# Patient Record
Sex: Male | Born: 1970
Health system: Southern US, Community
[De-identification: ages and names within clinical notes are randomized; demographics above are authoritative.]

## PROBLEM LIST (undated history)

## (undated) DIAGNOSIS — F32A Depression, unspecified: Secondary | ICD-10-CM

## (undated) DIAGNOSIS — T8859XA Other complications of anesthesia, initial encounter: Secondary | ICD-10-CM

## (undated) DIAGNOSIS — R112 Nausea with vomiting, unspecified: Secondary | ICD-10-CM

## (undated) DIAGNOSIS — F329 Major depressive disorder, single episode, unspecified: Secondary | ICD-10-CM

## (undated) DIAGNOSIS — Z9889 Other specified postprocedural states: Secondary | ICD-10-CM

## (undated) DIAGNOSIS — M549 Dorsalgia, unspecified: Secondary | ICD-10-CM

## (undated) DIAGNOSIS — T4145XA Adverse effect of unspecified anesthetic, initial encounter: Secondary | ICD-10-CM

## (undated) HISTORY — PX: BACK SURGERY: SHX140

---
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
## 2008-09-05 ENCOUNTER — Encounter
Admission: RE | Admit: 2008-09-05 | Discharge: 2008-09-26 | Payer: Self-pay | Admitting: Physical Medicine & Rehabilitation

## 2008-09-10 ENCOUNTER — Ambulatory Visit: Payer: Self-pay | Admitting: Physical Medicine & Rehabilitation

## 2008-09-23 ENCOUNTER — Ambulatory Visit: Payer: Self-pay | Admitting: Physical Medicine & Rehabilitation

## 2008-11-06 ENCOUNTER — Ambulatory Visit (HOSPITAL_COMMUNITY): Admission: RE | Admit: 2008-11-06 | Discharge: 2008-11-07 | Payer: Self-pay | Admitting: Neurosurgery

## 2010-04-09 LAB — CBC
HCT: 39.9 % (ref 39.0–52.0)
Hemoglobin: 13.9 g/dL (ref 13.0–17.0)
MCHC: 34.9 g/dL (ref 30.0–36.0)
MCV: 92.6 fL (ref 78.0–100.0)
Platelets: 255 10*3/uL (ref 150–400)
RBC: 4.31 MIL/uL (ref 4.22–5.81)
RDW: 12.9 % (ref 11.5–15.5)
WBC: 6.3 10*3/uL (ref 4.0–10.5)

## 2010-05-22 NOTE — Procedures (Signed)
NAMEBALTAZAR, Phillip Kane                ACCOUNT NO.:  [id number]   MEDICAL RECORD NO.:  [id number]          PATIENT TYPE:  REC   LOCATION:  [facility]                          FACILITY:  [facility]   PHYSICIAN:  Erick Colace, M.D.DATE OF BIRTH:  Jan 28, 1970   DATE OF PROCEDURE:  DATE OF DISCHARGE:                               OPERATIVE REPORT   PROCEDURE:  Left S1 and left L5 transforaminal lumbar epidural steroid  injection under fluoroscopic guidance.   INDICATIONS:  Left L5 and left S1 radiculopathy.  MRI demonstrating L4-5  disk compressing left L5 and S1 nerve roots.  Pain is only partially  responsive to medication management and other conservative care,  interferes with self-care mobility.   Informed consent was obtained after describing the risks and benefits of  the procedure with the patient.  These include bleeding, bruising, and  infection.  The patient elects to proceed and has given written consent.  The patient is placed prone on fluoroscopy table.  Betadine prep.  Sterile drape.  A 25-gauge 1-1/2-inch needle was used to anesthetize the  skin and subcu tissue.  A 1% lidocaine x2 mL at each two sites.  Then a  22-gauge 3-1/2-inch spinal needle was inserted first in the left S1  foramen.  AP, lateral, and oblique images utilized.  Omnipaque 180 under  live fluoro demonstrated good epidural spread followed by injection of 1  mL of 10 mg/mL dexamethasone and 1.5 mL of 1% lidocaine.  The same  procedure was repeated at the left L5-S1 intervertebral foramen using  same needle injectate and technique.  The patient tolerated the  procedure well.  Pre and post-injection vitals stable.  Post-injection  instructions given.  He will follow up with Dr. Lovell Sheehan.  See me on a  p.r.n. basis for repeat injection.      Erick Colace, M.D.  Electronically Signed     AEK/MEDQ  D:  09/10/2008 15:27:18  T:  09/11/2008 05:07:00  Job:  [id number]   cc:   Cristi Loron, M.D.  Fax:  [phone]

## 2010-05-22 NOTE — Procedures (Signed)
NAMEOLUMIDE, DOLINGER                ACCOUNT NO.:  [id number]   MEDICAL RECORD NO.:  [id number]          PATIENT TYPE:  REC   LOCATION:  [facility]                          FACILITY:  [facility]   PHYSICIAN:  Erick Colace, M.D.DATE OF BIRTH:  1970-09-17   DATE OF PROCEDURE:  09/23/2008  DATE OF DISCHARGE:                               OPERATIVE REPORT   This is a left S1 and left L5 transforaminal lumbar epidural steroid  injection under fluoroscopic guidance.   INDICATIONS:  Left L5 and left S1 radiculopathy.  MRI demonstrated L4-5  disk compressing both the left L5 and S1 nerve roots.  Pain is only  partially responsive to medication management.  Other conservative care  interferes with self-care mobility.  Onset was approximately 4 months  ago.   Previous injection was helpful, but is starting to wear off.  This was  performed 2 weeks ago.   Informed consent was obtained after describing risks and benefits of the  procedure with the patient.  These include bleeding, bruising,  infection.  He elects to proceed and has given written consent.  The  patient placed prone on fluoroscopy table.  Betadine prep, sterile  drape, a 25-gauge 1-1/2-inch needle was used to anesthetize skin and  subcu tissue with 1% lidocaine.  Then a 22-gauge 3-1/2-inch spinal  needle was inserted first at left S1 foramen; AP, lateral, and oblique  images utilized.  Omnipaque 180 under live fluoro demonstrated no  intravascular uptake and good epidural spread followed by injection of 1  mL of 10 mg/mL of dexamethasone and 1.5 mL of 1% lidocaine.  This  procedure was also performed on the left L5-S1 intervertebral foramen  using same needle, injectate, and technique.  The patient tolerated the  procedure well.  Pre and post injection vitals stable.  Post injection  instructions given.  Pre injection 7/10 and post injection 3/10.  Follow  up with Dr. Lovell Sheehan.      Erick Colace, M.D.  Electronically  Signed     AEK/MEDQ  D:  09/23/2008 11:45:30  T:  09/24/2008 04:21:11  Job:  [id number]   cc:   Cristi Loron, M.D.  Fax: [phone]

## 2013-07-25 ENCOUNTER — Ambulatory Visit (INDEPENDENT_AMBULATORY_CARE_PROVIDER_SITE_OTHER): Payer: PRIVATE HEALTH INSURANCE | Admitting: Family Medicine

## 2013-07-25 ENCOUNTER — Ambulatory Visit: Payer: PRIVATE HEALTH INSURANCE

## 2013-07-25 ENCOUNTER — Ambulatory Visit (INDEPENDENT_AMBULATORY_CARE_PROVIDER_SITE_OTHER): Payer: PRIVATE HEALTH INSURANCE

## 2013-07-25 VITALS — BP 130/72 | HR 63 | Temp 97.9°F | Resp 14 | Ht 75.0 in | Wt 187.0 lb

## 2013-07-25 DIAGNOSIS — M25551 Pain in right hip: Secondary | ICD-10-CM

## 2013-07-25 DIAGNOSIS — M5430 Sciatica, unspecified side: Secondary | ICD-10-CM

## 2013-07-25 DIAGNOSIS — M25561 Pain in right knee: Secondary | ICD-10-CM

## 2013-07-25 DIAGNOSIS — M25569 Pain in unspecified knee: Secondary | ICD-10-CM

## 2013-07-25 DIAGNOSIS — T148XXA Other injury of unspecified body region, initial encounter: Secondary | ICD-10-CM

## 2013-07-25 DIAGNOSIS — M25559 Pain in unspecified hip: Secondary | ICD-10-CM

## 2013-07-25 DIAGNOSIS — M5441 Lumbago with sciatica, right side: Secondary | ICD-10-CM

## 2013-07-25 DIAGNOSIS — R202 Paresthesia of skin: Secondary | ICD-10-CM

## 2013-07-25 DIAGNOSIS — R209 Unspecified disturbances of skin sensation: Secondary | ICD-10-CM

## 2013-07-25 MED ORDER — CYCLOBENZAPRINE HCL 5 MG PO TABS: 5.0000 mg | ORAL_TABLET | Freq: Every evening | ORAL | Status: DC | PRN

## 2013-07-25 MED ORDER — IBUPROFEN 600 MG PO TABS: 600.0000 mg | ORAL_TABLET | Freq: Three times a day (TID) | ORAL | Status: DC | PRN

## 2013-07-25 NOTE — Progress Notes (Signed)
Chief Complaint:  Chief Complaint  Patient presents with  . right knee pain    HPI: Phillip Kane is a 43 y.o. male who is here for Right hip. Low back and also lower leg pain which started after he caught his right foot on a pallet at work several days ago. He did not report this to his supervisor but instead went to the nruse and was given some tylenol and continues working even with the pain. 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 He was walkign and accidentally kicked his foot against one and when this happened his hip and leg went outward , sounds like his right hip externally rotated and started having pain . He also  has numbness and tingling associated with this which he never has had before. He has tried tylenol and also just massaging the area, hot and cold pads and that makes it better. No weakness, no prior hsitory of paresthesia, hip or knee pain or leg pain. HAs ahd problems with low back and has had surgery ? L4. No urinary sxs.   No past medical history on file. No past surgical history on file. History   Social History  . Marital Status: Married    Spouse Name: N/A    Number of Children: N/A  . Years of Education: N/A   Social History Main Topics  . Smoking status: Former Games developer  . Smokeless tobacco: Never Used  . Alcohol Use: Yes  . Drug Use: No  . Sexual Activity: None   Other Topics Concern  . None   Social History Narrative  . None   No family history on file. Allergies  Allergen Reactions  . Hydrocodone Nausea And Vomiting   Prior to Admission medications   Not on File     ROS: The patient denies fevers, chills, night sweats, unintentional weight loss, chest pain, palpitations, wheezing, dyspnea on exertion, nausea, vomiting, abdominal pain, dysuria, hematuria, melena, numbness, weakness, + tingling  All other systems have  been reviewed and were otherwise negative with the exception of those mentioned in the HPI and as above.    PHYSICAL EXAM: Filed Vitals:   07/25/13 1934  BP: 130/72  Pulse: 63  Temp: 97.9 F (36.6 C)  Resp: 14   Filed Vitals:   07/25/13 1934  Height: 6\' 3"  (1.905 m)  Weight: 187 lb (84.823 kg)   Body mass index is 23.37 kg/(m^2).  General: Alert, no acute distress HEENT:  Normocephalic, atraumatic, oropharynx patent. EOMI, PERRLA Cardiovascular:  Regular rate and rhythm, no rubs murmurs or gallops.  No Carotid bruits, radial pulse intact. No pedal edema.  Respiratory: Clear to auscultation bilaterally.  No wheezes, rales, or rhonchi.  No cyanosis, no use of accessory musculature GI: No organomegaly, abdomen is soft and non-tender, positive bowel sounds.  No masses. Skin: No rashes. Neurologic: Facial musculature symmetric. Psychiatric: Patient is appropriate throughout our interaction. Lymphatic: No cervical lymphadenopathy Musculoskeletal: Gait antalgic Neg deformity, Homans,  Full ROM 5/5 strength, 2/2 DTRs No saddle anesthesia Straight leg negative Hip and knee exam--normal    LABS: Results for orders placed during the hospital encounter of 11/06/08  CBC      Result Value Ref Range   WBC 6.3  4.0 - 10.5 K/uL   RBC 4.31  4.22 - 5.81 MIL/uL   Hemoglobin 13.9  13.0 -  17.0 g/dL   HCT 16.139.9  09.639.0 - 04.552.0 %   MCV 92.6  78.0 - 100.0 fL   MCHC 34.9  30.0 - 36.0 g/dL   RDW 40.912.9  81.111.5 - 91.415.5 %   Platelets 255  150 - 400 K/uL     EKG/XRAY:   [facility]. Neg for fx or dislocation   ASSESSMENT/PLAN: Encounter Diagnoses  Name Primary?  . Right-sided low back pain with right-sided sciatica Yes  . Hip pain, acute, right   . Pain in joint, lower leg, right   . Paresthesia   . Sprain and strain     Rx Ibuprofen Rx Flexeril He declined any steroids at thsi time, will call me if he needs it Neuro exam was normal F/u prn  Gross  sideeffects, risk and benefits, and alternatives of medications d/w patient. Patient is aware that all medications have potential sideeffects and we are unable to predict every sideeffect or drug-drug interaction that may occur.  Mersadies Petree PHUONG, DO 07/25/2013 9:00 PM

## 2013-08-29 ENCOUNTER — Telehealth: Payer: Self-pay

## 2014-06-18 ENCOUNTER — Telehealth: Payer: Self-pay

## 2014-12-10 NOTE — Telephone Encounter (Signed)
done

## 2015-01-08 ENCOUNTER — Ambulatory Visit (INDEPENDENT_AMBULATORY_CARE_PROVIDER_SITE_OTHER): Payer: PRIVATE HEALTH INSURANCE | Admitting: Emergency Medicine

## 2015-01-08 VITALS — BP 160/78 | HR 96 | Temp 98.2°F | Resp 18 | Ht 76.0 in | Wt 200.0 lb

## 2015-01-08 DIAGNOSIS — R062 Wheezing: Secondary | ICD-10-CM | POA: Diagnosis not present

## 2015-01-08 DIAGNOSIS — T7840XA Allergy, unspecified, initial encounter: Secondary | ICD-10-CM

## 2015-01-08 MED ORDER — DIPHENHYDRAMINE HCL 25 MG PO CAPS: 25.0000 mg | ORAL_CAPSULE | Freq: Four times a day (QID) | ORAL | Status: DC | PRN

## 2015-01-08 MED ORDER — ALBUTEROL SULFATE (2.5 MG/3ML) 0.083% IN NEBU
5.0000 mg | INHALATION_SOLUTION | Freq: Once | RESPIRATORY_TRACT | Status: AC
  Administered 2015-01-08: 5 mg via RESPIRATORY_TRACT

## 2015-01-08 MED ORDER — METHYLPREDNISOLONE SODIUM SUCC 125 MG IJ SOLR
125.0000 mg | Freq: Once | INTRAMUSCULAR | Status: AC
  Administered 2015-01-08: 125 mg via INTRAVENOUS

## 2015-01-08 MED ORDER — DIPHENHYDRAMINE HCL 25 MG PO CAPS
50.0000 mg | ORAL_CAPSULE | Freq: Once | ORAL | Status: AC
  Administered 2015-01-08: 50 mg via ORAL

## 2015-01-08 MED ORDER — PREDNISONE 10 MG (21) PO TBPK: ORAL_TABLET | ORAL | Status: DC

## 2015-01-08 NOTE — Progress Notes (Signed)
Subjective:  Patient ID: Phillip Kane, male    DOB: 12-23-1970  Age: 45 y.o. MRN: [id number]  CC: Allergic Reaction   HPI Phillip ParisSenad Kane presents  patient was brought back as an emergency with generalized hives and wheezing and shortness of breath after taking a dose of amoxicillin today. He is unclear about why he is taking that dose of antibiotic he said it's been laying around his house for quite a while. He does admit to some sneezing before he took the antibiotic and thought he might have an upper respiratory infection. He denies any fever chills chest pain tightness or heaviness. Has no nausea or vomiting. He has no cough nasal congestion postnasal drainage. No difficulty swallowing. Taken no medication treat his symptoms.  History Phillip Kane has no past medical history on file.   He has no past surgical history on file.   His  family history is not on file.  He   reports that he has quit smoking. He has never used smokeless tobacco. He reports that he drinks alcohol. He reports that he does not use illicit drugs.  Outpatient Prescriptions Prior to Visit  Medication Sig Dispense Refill  . cyclobenzaprine (FLEXERIL) 5 MG tablet Take 1 tablet (5 mg total) by mouth at bedtime as needed for muscle spasms. (Patient not taking: Reported on 01/08/2015) 30 tablet 0  . ibuprofen (ADVIL,MOTRIN) 600 MG tablet Take 1 tablet (600 mg total) by mouth every 8 (eight) hours as needed. Take with food, no other NSAIDs. (Patient not taking: Reported on 01/08/2015) 30 tablet 1   No facility-administered medications prior to visit.    Social History   Social History  . Marital Status: Married    Spouse Name: N/A  . Number of Children: N/A  . Years of Education: N/A   Social History Main Topics  . Smoking status: Former Games developermoker  . Smokeless tobacco: Never Used  . Alcohol Use: Yes  . Drug Use: No  . Sexual Activity: Not Asked   Other Topics Concern  . None   Social History Narrative      Review of Systems  Constitutional: Positive for fatigue. Negative for fever, chills and appetite change.  HENT: Negative for congestion, ear pain, postnasal drip, sinus pressure and sore throat.   Eyes: Negative for pain and redness.  Respiratory: Positive for wheezing. Negative for cough and shortness of breath.   Cardiovascular: Negative for leg swelling.  Gastrointestinal: Negative for nausea, vomiting, abdominal pain, diarrhea, constipation and blood in stool.  Endocrine: Negative for polyuria.  Genitourinary: Negative for dysuria, urgency, frequency and flank pain.  Musculoskeletal: Negative for gait problem.  Skin: Positive for rash.  Neurological: Negative for weakness and headaches.  Psychiatric/Behavioral: Negative for confusion and decreased concentration. The patient is not nervous/anxious.     Objective:  BP 160/78 mmHg  Pulse 96  Temp(Src) 98.2 F (36.8 C) (Oral)  Resp 18  Ht 6\' 4"  (1.93 m)  Wt 200 lb (90.719 kg)  BMI 24.35 kg/m2  SpO2 98%  Physical Exam  Constitutional: He is oriented to person, place, and time. He appears well-developed and well-nourished. No distress.  HENT:  Head: Normocephalic and atraumatic.  Right Ear: External ear normal.  Left Ear: External ear normal.  Nose: Nose normal.  Eyes: Conjunctivae and EOM are normal. Pupils are equal, round, and reactive to light. No scleral icterus.  Neck: Normal range of motion. Neck supple. No tracheal deviation present.  Cardiovascular: Normal rate, regular rhythm and normal heart sounds.  Pulmonary/Chest: Effort normal. No respiratory distress. He has wheezes. He has no rales.  Abdominal: He exhibits no mass. There is no tenderness. There is no rebound and no guarding.  Musculoskeletal: He exhibits no edema.  Lymphadenopathy:    He has no cervical adenopathy.  Neurological: He is alert and oriented to person, place, and time. Coordination normal.  Skin: Skin is warm and dry. Rash (Generalized  hives) noted.  Psychiatric: He has a normal mood and affect. His behavior is normal.      Assessment & Plan:   Phillip Kane was seen today for allergic reaction.  Diagnoses and all orders for this visit:  Allergic reaction, initial encounter -     diphenhydrAMINE (BENADRYL) capsule 50 mg; Take 2 capsules (50 mg total) by mouth once. -     methylPREDNISolone sodium succinate (SOLU-MEDROL) 125 mg/2 mL injection 125 mg; Inject 2 mLs (125 mg total) into the vein once.  Wheezing -     albuterol (PROVENTIL) (2.5 MG/3ML) 0.083% nebulizer solution 5 mg; Take 6 mLs (5 mg total) by nebulization once.  Other orders -     predniSONE (STERAPRED UNI-PAK 21 TAB) 10 MG (21) TBPK tablet; Take as directed on package -     diphenhydrAMINE (BENADRYL) 25 mg capsule; Take 1 capsule (25 mg total) by mouth every 6 (six) hours as needed.   I am having Phillip Kane start on predniSONE and diphenhydrAMINE. I am also having him maintain his ibuprofen and cyclobenzaprine. We administered diphenhydrAMINE, albuterol, and methylPREDNISolone sodium succinate.  Meds ordered this encounter  Medications  . diphenhydrAMINE (BENADRYL) capsule 50 mg    Sig:   . albuterol (PROVENTIL) (2.5 MG/3ML) 0.083% nebulizer solution 5 mg    Sig:   . predniSONE (STERAPRED UNI-PAK 21 TAB) 10 MG (21) TBPK tablet    Sig: Take as directed on package    Dispense:  21 tablet    Refill:  0  . diphenhydrAMINE (BENADRYL) 25 mg capsule    Sig: Take 1 capsule (25 mg total) by mouth every 6 (six) hours as needed.    Dispense:  30 capsule    Refill:  0  . methylPREDNISolone sodium succinate (SOLU-MEDROL) 125 mg/2 mL injection 125 mg    Sig:    He is markedly improved at the time of discharge and brought back initially as an emergency and interrupted the flow patients. Not nebulized aerosol treatment intravenous Solu-Medrol and Benadryl and had marked improvement in his aeration and cessation of wheezing is admonished not to take any more  amoxicillin and to always finish his antibiotics when he is prescribed  Appropriate red flag conditions were discussed with the patient as well as actions that should be taken.  Patient expressed his understanding.  Follow-up: Return if symptoms worsen or fail to improve.  Carmelina Dane, MD

## 2015-01-08 NOTE — Patient Instructions (Signed)
Hives Hives are itchy, red, swollen areas of the skin. They can vary in size and location on your body. Hives can come and go for hours or several days (acute hives) or for several weeks (chronic hives). Hives do not spread from person to person (noncontagious). They may get worse with scratching, exercise, and emotional stress. CAUSES   Allergic reaction to food, additives, or drugs.  Infections, including the common cold.  Illness, such as vasculitis, lupus, or thyroid disease.  Exposure to sunlight, heat, or cold.  Exercise.  Stress.  Contact with chemicals. SYMPTOMS   Red or white swollen patches on the skin. The patches may change size, shape, and location quickly and repeatedly.  Itching.  Swelling of the hands, feet, and face. This may occur if hives develop deeper in the skin. DIAGNOSIS  Your caregiver can usually tell what is wrong by performing a physical exam. Skin or blood tests may also be done to determine the cause of your hives. In some cases, the cause cannot be determined. TREATMENT  Mild cases usually get better with medicines such as antihistamines. Severe cases may require an emergency epinephrine injection. If the cause of your hives is known, treatment includes avoiding that trigger.  HOME CARE INSTRUCTIONS   Avoid causes that trigger your hives.  Take antihistamines as directed by your caregiver to reduce the severity of your hives. Non-sedating or low-sedating antihistamines are usually recommended. Do not drive while taking an antihistamine.  Take any other medicines prescribed for itching as directed by your caregiver.  Wear loose-fitting clothing.  Keep all follow-up appointments as directed by your caregiver. SEEK MEDICAL CARE IF:   You have persistent or severe itching that is not relieved with medicine.  You have painful or swollen joints. SEEK IMMEDIATE MEDICAL CARE IF:   You have a fever.  Your tongue or lips are swollen.  You have  trouble breathing or swallowing.  You feel tightness in the throat or chest.  You have abdominal pain. These problems may be the first sign of a life-threatening allergic reaction. Call your local emergency services (911 in [place] MAKE SURE YOU:   Understand these instructions.  Will watch your condition.  Will get help right away if you are not doing well or get worse.   This information is not intended to replace advice given to you by your health care provider. Make sure you discuss any questions you have with your health care provider.   Document Released: 12/21/2004 Document Revised: 12/26/2012 Document Reviewed: 03/16/2011 Elsevier Interactive Patient Education 2016 [employer] Inc[employer]  

## 2015-01-09 ENCOUNTER — Ambulatory Visit (INDEPENDENT_AMBULATORY_CARE_PROVIDER_SITE_OTHER): Payer: PRIVATE HEALTH INSURANCE | Admitting: Family Medicine

## 2015-01-09 VITALS — BP 132/80 | HR 102 | Temp 97.9°F | Resp 17 | Ht 76.0 in | Wt 200.0 lb

## 2015-01-09 DIAGNOSIS — T7840XD Allergy, unspecified, subsequent encounter: Secondary | ICD-10-CM

## 2015-01-09 DIAGNOSIS — L282 Other prurigo: Secondary | ICD-10-CM | POA: Diagnosis not present

## 2015-01-09 MED ORDER — DIPHENHYDRAMINE HCL 50 MG/ML IJ SOLN
25.0000 mg | Freq: Once | INTRAMUSCULAR | Status: AC
  Administered 2015-01-09: 25 mg via INTRAMUSCULAR

## 2015-01-09 MED ORDER — METHYLPREDNISOLONE ACETATE 80 MG/ML IJ SUSP
40.0000 mg | Freq: Once | INTRAMUSCULAR | Status: AC
  Administered 2015-01-09: 40 mg via INTRAMUSCULAR

## 2015-01-09 MED ORDER — RANITIDINE HCL 150 MG PO TABS
150.0000 mg | ORAL_TABLET | Freq: Once | ORAL | Status: AC
  Administered 2015-01-09: 150 mg via ORAL

## 2015-01-09 NOTE — Progress Notes (Signed)
Chief Complaint:  Chief Complaint  Patient presents with  . Allergic Reaction    rash all over body   . Depression    HPI: Phillip Kane is a 45 y.o. male who reports to [facility] today complaining of recheck on hivs/rash, allergic rx, s/p steroid injection  Nothing new food medicine travels meds wise. He had a sinus congestion and then this happened.  Allergy sxs is better but this afternoon rash returned   No past medical history on file. No past surgical history on file. Social History   Social History  . Marital Status: Married    Spouse Name: N/A  . Number of Children: N/A  . Years of Education: N/A   Social History Main Topics  . Smoking status: Current Every Day Smoker -- 0.20 packs/day for 25 years    Types: Cigarettes  . Smokeless tobacco: Never Used  . Alcohol Use: 0.0 oz/week    0 Standard drinks or equivalent per week  . Drug Use: No  . Sexual Activity: Not Asked   Other Topics Concern  . None   Social History Narrative   No family history on file. Allergies  Allergen Reactions  . Hydrocodone Nausea And Vomiting   Prior to Admission medications   Medication Sig Start Date End Date Taking? Authorizing Provider  diphenhydrAMINE (BENADRYL) 25 mg capsule Take 1 capsule (25 mg total) by mouth every 6 (six) hours as needed. 01/08/15  Yes Carmelina Dane, MD  predniSONE (STERAPRED UNI-PAK 21 TAB) 10 MG (21) TBPK tablet Take as directed on package 01/08/15  Yes Carmelina Dane, MD     ROS: The patient denies fevers, chills, night sweats, unintentional weight loss, chest pain, palpitations, wheezing, dyspnea on exertion, nausea, vomiting, abdominal pain, dysuria, hematuria, melena, numbness, weakness, or tingling.   All other systems have been reviewed and were otherwise negative with the exception of those mentioned in the HPI and as above.    PHYSICAL EXAM: Filed Vitals:   01/09/15 1616  BP: 132/80  Pulse: 102  Temp: 97.9 F (36.6 C)  Resp: 17     Body mass index is 24.35 kg/(m^2).   General: Alert, no acute distress HEENT:  Normocephalic, atraumatic, oropharynx patent. EOMI, PERRLA Cardiovascular:  Regular rate and rhythm, no rubs murmurs or gallops.  No Carotid bruits, radial pulse intact. No pedal edema.  Respiratory: Clear to auscultation bilaterally.  No wheezes, rales, or rhonchi.  No cyanosis, no use of accessory musculature Abdominal: No organomegaly, abdomen is soft and non-tender, positive bowel sounds. No masses. Skin: + hives Neurologic: Facial musculature symmetric. Psychiatric: Patient acts appropriately throughout our interaction. Lymphatic: No cervical or submandibular lymphadenopathy Musculoskeletal: Gait intact. No edema, tenderness   LABS: Results for orders placed or performed during the hospital encounter of 11/06/08  CBC  Result Value Ref Range   WBC 6.3 4.0 - 10.5 K/uL   RBC 4.31 4.22 - 5.81 MIL/uL   Hemoglobin 13.9 13.0 - 17.0 g/dL   HCT 16.1 09.6 - 04.5 %   MCV 92.6 78.0 - 100.0 fL   MCHC 34.9 30.0 - 36.0 g/dL   RDW 40.9 81.1 - 91.4 %   Platelets 255 150 - 400 K/uL     EKG/XRAY:   Primary read interpreted by Dr. Conley Rolls at [facility].   ASSESSMENT/PLAN: Encounter Diagnoses  Name Primary?  . Allergic reaction, subsequent encounter Yes  . Pruritic rash    Benadryl 25 mg IM x 1 Depomedrol 40 mg IM  x 1 Zantac PO x1 He will cont with Benadryl 50 mg q 6-8 hrs.  Refer to  Get allergy testing Fu in AM   Gross sideeffects, risk and benefits, and alternatives of medications d/w patient. Patient is aware that all medications have potential sideeffects and we are unable to predict every sideeffect or drug-drug interaction that may occur.  Phillip Le DO  01/09/2015 6:47 PM   01/10/2015 I spoke with him and rash improved after he went  Ome, he will be taking his benadryl 50 mg and it seems to keep thigs calm. He states his wife switched detergents from All to TIDE

## 2015-01-11 ENCOUNTER — Telehealth: Payer: Self-pay | Admitting: Family Medicine

## 2015-01-11 NOTE — Telephone Encounter (Signed)
Received a call from the ansewring service- our office is closed today due to snow.  Spoke to his daughter as pt does not speak much [place].  He was seen in the office on 1/4 and again on 1/5 following an apparent allergic reaction to amoxicillin.  Was given IV solumedrol on 1/4 and IM depomedrol the next day.  He is on an oral steroid taper and also taking benadryl.  He took a dose of benadryl about 2 hours ago.  It did not seem to make a difference. However his sx now consist of variable hives on his body and no angioedema, SOB or distress.  Advised them to use zyrtec or claritin if they have some on hand and to continue every 6 hours benadry7l. However as long as he does not have any more severe symptoms there is nothing further to do at this time- cautioned that if more severe sx such as SOB or antioedema they should proceed to the ER asap.  His daughter stated understanding

## 2015-09-16 ENCOUNTER — Other Ambulatory Visit: Payer: Self-pay | Admitting: Family Medicine

## 2015-09-17 LAB — CMP12+LP+TP+TSH+6AC+PSA+CBC…
ALT: 19 IU/L (ref 0–44)
AST: 17 IU/L (ref 0–40)
Albumin/Globulin Ratio: 2.1 (ref 1.2–2.2)
Albumin: 4.6 g/dL (ref 3.5–5.5)
Alkaline Phosphatase: 50 IU/L (ref 39–117)
BUN/Creatinine Ratio: 13 (ref 9–20)
BUN: 10 mg/dL (ref 6–24)
Basophils Absolute: 0 10*3/uL (ref 0.0–0.2)
Basos: 0 %
Bilirubin Total: 0.6 mg/dL (ref 0.0–1.2)
Calcium: 8.8 mg/dL (ref 8.7–10.2)
Chloride: 98 mmol/L (ref 96–106)
Chol/HDL Ratio: 4.9 ratio units (ref 0.0–5.0)
Cholesterol, Total: 182 mg/dL (ref 100–199)
Creatinine, Ser: 0.79 mg/dL (ref 0.76–1.27)
EOS (ABSOLUTE): 0.1 10*3/uL (ref 0.0–0.4)
Eos: 3 %
Estimated CHD Risk: 1 times avg. (ref 0.0–1.0)
Free Thyroxine Index: 2 (ref 1.2–4.9)
GFR calc Af Amer: 125 mL/min/{1.73_m2} (ref >59)
GFR calc non Af Amer: 108 mL/min/{1.73_m2} (ref >59)
GGT: 14 IU/L (ref 0–65)
Globulin, Total: 2.2 g/dL (ref 1.5–4.5)
Glucose: 87 mg/dL (ref 65–99)
HDL: 37 mg/dL — ABNORMAL LOW (ref >39)
Hematocrit: 40.9 % (ref 37.5–51.0)
Hemoglobin: 14 g/dL (ref 12.6–17.7)
Immature Grans (Abs): 0 10*3/uL (ref 0.0–0.1)
Immature Granulocytes: 0 %
Iron: 105 ug/dL (ref 38–169)
LDH: 162 IU/L (ref 121–224)
LDL Calculated: 101 mg/dL — ABNORMAL HIGH (ref 0–99)
Lymphocytes Absolute: 1.3 10*3/uL (ref 0.7–3.1)
Lymphs: 25 %
MCH: 30.5 pg (ref 26.6–33.0)
MCHC: 34.2 g/dL (ref 31.5–35.7)
MCV: 89 fL (ref 79–97)
Monocytes Absolute: 0.3 10*3/uL (ref 0.1–0.9)
Monocytes: 6 %
Neutrophils Absolute: 3.4 10*3/uL (ref 1.4–7.0)
Neutrophils: 66 %
Phosphorus: 2.6 mg/dL (ref 2.5–4.5)
Platelets: 214 10*3/uL (ref 150–379)
Potassium: 4.2 mmol/L (ref 3.5–5.2)
Prostate Specific Ag, Serum: 0.3 ng/mL (ref 0.0–4.0)
RBC: 4.59 x10E6/uL (ref 4.14–5.80)
RDW: 14 % (ref 12.3–15.4)
Sodium: 139 mmol/L (ref 134–144)
T3 Uptake Ratio: 26 % (ref 24–39)
T4, Total: 7.5 ug/dL (ref 4.5–12.0)
TSH: 1.84 u[IU]/mL (ref 0.450–4.500)
Total Protein: 6.8 g/dL (ref 6.0–8.5)
Triglycerides: 221 mg/dL — ABNORMAL HIGH (ref 0–149)
Uric Acid: 5.2 mg/dL (ref 3.7–8.6)
VLDL Cholesterol Cal: 44 mg/dL — ABNORMAL HIGH (ref 5–40)
WBC: 5.2 10*3/uL (ref 3.4–10.8)

## 2015-09-17 LAB — HGB A1C W/O EAG: Hgb A1c MFr Bld: 5.7 % — ABNORMAL HIGH (ref 4.8–5.6)

## 2016-01-05 HISTORY — PX: BACK SURGERY: SHX140

## 2016-09-27 ENCOUNTER — Ambulatory Visit: Payer: Self-pay | Admitting: *Deleted

## 2016-09-27 ENCOUNTER — Encounter (HOSPITAL_COMMUNITY): Payer: Self-pay | Admitting: Emergency Medicine

## 2016-09-27 ENCOUNTER — Emergency Department (HOSPITAL_COMMUNITY)
Admission: EM | Admit: 2016-09-27 | Discharge: 2016-09-27 | Disposition: A | Discharge status: Home / Self Care | Payer: Worker's Compensation | Attending: Emergency Medicine | Admitting: Emergency Medicine

## 2016-09-27 VITALS — BP 210/110 | HR 90

## 2016-09-27 DIAGNOSIS — F17210 Nicotine dependence, cigarettes, uncomplicated: Secondary | ICD-10-CM | POA: Insufficient documentation

## 2016-09-27 DIAGNOSIS — X500XXA Overexertion from strenuous movement or load, initial encounter: Secondary | ICD-10-CM | POA: Insufficient documentation

## 2016-09-27 DIAGNOSIS — S39012A Strain of muscle, fascia and tendon of lower back, initial encounter: Secondary | ICD-10-CM | POA: Diagnosis not present

## 2016-09-27 DIAGNOSIS — Y9389 Activity, other specified: Secondary | ICD-10-CM | POA: Insufficient documentation

## 2016-09-27 DIAGNOSIS — M545 Low back pain: Secondary | ICD-10-CM

## 2016-09-27 DIAGNOSIS — Z885 Allergy status to narcotic agent status: Secondary | ICD-10-CM | POA: Diagnosis not present

## 2016-09-27 DIAGNOSIS — Y990 Civilian activity done for income or pay: Secondary | ICD-10-CM | POA: Insufficient documentation

## 2016-09-27 DIAGNOSIS — Y929 Unspecified place or not applicable: Secondary | ICD-10-CM | POA: Diagnosis not present

## 2016-09-27 MED ORDER — IBUPROFEN 600 MG PO TABS: 600.0000 mg | ORAL_TABLET | Freq: Three times a day (TID) | ORAL | 0 refills | Status: DC | PRN

## 2016-09-27 MED ORDER — IBUPROFEN 200 MG PO TABS
600.0000 mg | ORAL_TABLET | Freq: Once | ORAL | Status: AC
  Administered 2016-09-27: 600 mg via ORAL
  Filled 2016-09-27: qty 1

## 2016-09-27 MED ORDER — CYCLOBENZAPRINE HCL 10 MG PO TABS: 10.0000 mg | ORAL_TABLET | Freq: Three times a day (TID) | ORAL | 0 refills | Status: DC | PRN

## 2016-09-27 NOTE — ED Provider Notes (Signed)
[facility]-EMERGENCY DEPT Provider Note   CSN: [id number] Arrival date & time: 09/27/16  0920     History   Chief Complaint Chief Complaint  Patient presents with  . Back Pain    HPI Kesean Serviss is a 46 y.o. male.  HPI  46 year old male presents with worsening low back pain. About 3 months ago he had lumbar fusion surgery (L4-5) in [place]. Patient first went back to work at the end of last week and noticed some pain. He is on restrictions. Pain is worse with getting up or certain movements. He seemed to be getting worse over the weekend they went back to work again today, Monday, and the pain seemed to worsen. He took some of his own tramadol and was given Tylenol at work. He was told his blood pressure was high and he needed to be evaluated. He's had some very minimal cough urinary incontinence since the surgery but this is not worsening. However he denies any other bladder or bowel incontinence. He occasionally has shooting, electrical pain down his legs, left worse than right. This is not new since surgery either. No new weakness or numbness. He states his feet are cold but has been that way since before the surgery. The pain is across his entire low back. No fevers.   History reviewed. No pertinent past medical history.  There are no active problems to display for this patient.   History reviewed. No pertinent surgical history.     Home Medications    Prior to Admission medications   Medication Sig Start Date End Date Taking? Authorizing Provider  cyclobenzaprine (FLEXERIL) 10 MG tablet Take 1 tablet (10 mg total) by mouth 3 (three) times daily as needed for muscle spasms. 09/27/16   Pricilla Loveless, MD  diphenhydrAMINE (BENADRYL) 25 mg capsule Take 1 capsule (25 mg total) by mouth every 6 (six) hours as needed. 01/08/15   Carmelina Dane, MD  ibuprofen (ADVIL,MOTRIN) 600 MG tablet Take 1 tablet (600 mg total) by mouth every 8 (eight) hours as needed. 09/27/16   Pricilla Loveless, MD  predniSONE (STERAPRED UNI-PAK 21 TAB) 10 MG (21) TBPK tablet Take as directed on package 01/08/15   Carmelina Dane, MD    Family History History reviewed. No pertinent family history.  Social History Social History  Substance Use Topics  . Smoking status: Current Every Day Smoker    Packs/day: 0.20    Years: 25.00    Types: Cigarettes  . Smokeless tobacco: Never Used  . Alcohol use 0.0 oz/week     Allergies   Hydrocodone   Review of Systems Review of Systems  Constitutional: Negative for fever.  Gastrointestinal: Negative for abdominal pain.  Musculoskeletal: Positive for back pain.  Neurological: Negative for weakness and numbness.  All other systems reviewed and are negative.    Physical Exam Updated Vital Signs BP (!) 156/79 (BP Location: Right Arm)   Pulse 69   Temp 98.5 F (36.9 C) (Oral)   Resp 16   SpO2 100%   Physical Exam  Constitutional: He is oriented to person, place, and time. He appears well-developed and well-nourished.  HENT:  Head: Normocephalic and atraumatic.  Right Ear: External ear normal.  Left Ear: External ear normal.  Nose: Nose normal.  Eyes: Right eye exhibits no discharge. Left eye exhibits no discharge.  Neck: Neck supple.  Cardiovascular: Normal rate, regular rhythm and normal heart sounds.   Pulmonary/Chest: Effort normal and breath sounds normal.  Abdominal: Soft.  He exhibits no distension. There is no tenderness.  Musculoskeletal: He exhibits no edema.       Lumbar back: He exhibits tenderness (diffuse lumbar tenderness).       Back:  Neurological: He is alert and oriented to person, place, and time. He displays no Babinski's sign on the right side. He displays no Babinski's sign on the left side.  Reflex Scores:      Patellar reflexes are 2+ on the right side and 2+ on the left side.      Achilles reflexes are 2+ on the right side and 2+ on the left side. 5/5 strength in BLE somewhat limited due to pain.  Normal gross sensation  Skin: Skin is warm and dry.  Nursing note and vitals reviewed.    ED Treatments / Results  Labs (all labs ordered are listed, but only abnormal results are displayed) Labs Reviewed - No data to display  EKG  EKG Interpretation None       Radiology No results found.  Procedures Procedures (including critical care time)  Medications Ordered in ED Medications  ibuprofen (ADVIL,MOTRIN) tablet 600 mg (600 mg Oral Given 09/27/16 1308)     Initial Impression / Assessment and Plan / ED Course  I have reviewed the triage vital signs and the nursing notes.  Pertinent labs & imaging results that were available during my care of the patient were reviewed by me and considered in my medical decision making (see chart for details).     I think the patient is having worsening of his low back issue after going back to work. Likely a strain of his muscles from having not using very much over the last few months. I highly doubt spinal cord emergency. He does endorse some urinary incontinence when coughing only but this has been going on and not worsening for several months. I do not think this represents likely spinal cord emergency or cauda equina. I discussed adding ibuprofen and muscle relaxer to his tramadol and Tylenol. Discussed needing to follow-up with his spinal surgeon this week or next. Discussed return precautions.  Final Clinical Impressions(s) / ED Diagnoses   Final diagnoses:  Strain of lumbar region, initial encounter    New Prescriptions New Prescriptions   CYCLOBENZAPRINE (FLEXERIL) 10 MG TABLET    Take 1 tablet (10 mg total) by mouth 3 (three) times daily as needed for muscle spasms.   IBUPROFEN (ADVIL,MOTRIN) 600 MG TABLET    Take 1 tablet (600 mg total) by mouth every 8 (eight) hours as needed.     Pricilla Loveless, MD 09/27/16 [phone]

## 2016-09-27 NOTE — Progress Notes (Signed)
Pt presents to RN office via w/c with supervisor and spouse. Reports back surgery in June, was out of work for past 6 months and just returned last week. Last night began having low back pain with bil leg pain and tingling/numbness, which progressively worsened through the night. Was able to make it in to work this am but shortly thereafter pain became unbearable despite medication, Tramadol, which typically controls pain well.  Pt unable to stand or ambulate without assistance. Pt given  Tylenol in office. Pt's surgeon is Dr. Simon Rhein with [facility] in [place]. Pt unable to tolerate car ride that distance to office. RN called surgeon's office, only able to leave message for surgeon or nurse for return phone call to pt's spouses' phone. RN advised pt he could proceed to the ED for evaluation due to acute onset of pain last night with no known aggravating factor. Pt assisted to vehicle via w/c. 3 person assist to transfer to vehicle. HR aware of situation and recommendation as pt's case is workers' comp. Spouse given claim # and contact information to provide to ED. Charge RN Barbara Cower at [facility] ED made aware of pt's impending arrival.

## 2016-09-27 NOTE — ED Triage Notes (Signed)
Pt to ER for evaluation of lumbar back pain onset two days ago. States had L4-L5 lumbar fusion 3 months ago and returned to work last Thursday for the first time since. Pt is a/o x4. States pain is better since tramadol and tylenol that was given to prior to arrival. VSS.

## 2016-10-19 ENCOUNTER — Ambulatory Visit: Payer: Self-pay | Admitting: *Deleted

## 2016-10-19 VITALS — BP 130/80 | Ht 76.0 in | Wt 190.0 lb

## 2016-10-19 DIAGNOSIS — Z0000 Encounter for general adult medical examination without abnormal findings: Secondary | ICD-10-CM

## 2016-10-19 NOTE — Progress Notes (Signed)
[facility] insurance premium discount evaluation: Labs Drawn. Replacements ROI form signed. Tobacco Free Attestation form signed.  Forms placed in paper chart. Okay to route results to pcp per pt. 

## 2016-10-20 LAB — CMP12+LP+TP+TSH+6AC+PSA+CBC…
ALT: 16 IU/L (ref 0–44)
AST: 13 IU/L (ref 0–40)
Albumin/Globulin Ratio: 2.3 — ABNORMAL HIGH (ref 1.2–2.2)
Albumin: 4.8 g/dL (ref 3.5–5.5)
Alkaline Phosphatase: 64 IU/L (ref 39–117)
BUN/Creatinine Ratio: 8 — ABNORMAL LOW (ref 9–20)
BUN: 7 mg/dL (ref 6–24)
Basophils Absolute: 0 10*3/uL (ref 0.0–0.2)
Basos: 0 %
Bilirubin Total: 0.5 mg/dL (ref 0.0–1.2)
Calcium: 9.6 mg/dL (ref 8.7–10.2)
Chloride: 102 mmol/L (ref 96–106)
Chol/HDL Ratio: 6 ratio — ABNORMAL HIGH (ref 0.0–5.0)
Cholesterol, Total: 198 mg/dL (ref 100–199)
Creatinine, Ser: 0.83 mg/dL (ref 0.76–1.27)
EOS (ABSOLUTE): 0.2 10*3/uL (ref 0.0–0.4)
Eos: 3 %
Estimated CHD Risk: 1.3 times avg. — ABNORMAL HIGH (ref 0.0–1.0)
Free Thyroxine Index: 1.9 (ref 1.2–4.9)
GFR calc Af Amer: 122 mL/min/{1.73_m2} (ref >59)
GFR calc non Af Amer: 106 mL/min/{1.73_m2} (ref >59)
GGT: 14 IU/L (ref 0–65)
Globulin, Total: 2.1 g/dL (ref 1.5–4.5)
Glucose: 93 mg/dL (ref 65–99)
HDL: 33 mg/dL — ABNORMAL LOW (ref >39)
Hematocrit: 42.2 % (ref 37.5–51.0)
Hemoglobin: 14 g/dL (ref 13.0–17.7)
Immature Grans (Abs): 0 10*3/uL (ref 0.0–0.1)
Immature Granulocytes: 0 %
Iron: 126 ug/dL (ref 38–169)
LDH: 170 IU/L (ref 121–224)
LDL Calculated: 110 mg/dL — ABNORMAL HIGH (ref 0–99)
Lymphocytes Absolute: 1.5 10*3/uL (ref 0.7–3.1)
Lymphs: 29 %
MCH: 30.4 pg (ref 26.6–33.0)
MCHC: 33.2 g/dL (ref 31.5–35.7)
MCV: 92 fL (ref 79–97)
Monocytes Absolute: 0.4 10*3/uL (ref 0.1–0.9)
Monocytes: 8 %
Neutrophils Absolute: 3.1 10*3/uL (ref 1.4–7.0)
Neutrophils: 60 %
Phosphorus: 3.5 mg/dL (ref 2.5–4.5)
Potassium: 4.9 mmol/L (ref 3.5–5.2)
Prostate Specific Ag, Serum: 0.3 ng/mL (ref 0.0–4.0)
RBC: 4.61 x10E6/uL (ref 4.14–5.80)
RDW: 14 % (ref 12.3–15.4)
Sodium: 143 mmol/L (ref 134–144)
T3 Uptake Ratio: 25 % (ref 24–39)
T4, Total: 7.4 ug/dL (ref 4.5–12.0)
TSH: 1.74 u[IU]/mL (ref 0.450–4.500)
Total Protein: 6.9 g/dL (ref 6.0–8.5)
Triglycerides: 276 mg/dL — ABNORMAL HIGH (ref 0–149)
Uric Acid: 4.7 mg/dL (ref 3.7–8.6)
VLDL Cholesterol Cal: 55 mg/dL — ABNORMAL HIGH (ref 5–40)
WBC: 5.3 10*3/uL (ref 3.4–10.8)

## 2016-10-20 LAB — HGB A1C W/O EAG: Hgb A1c MFr Bld: 5.7 % — ABNORMAL HIGH (ref 4.8–5.6)

## 2016-10-21 NOTE — Progress Notes (Signed)
Results reviewed with pt. Lipids worsened from past year. A1c same as previous, slightly elevated. Pt endorses poor diet and decreased exercise 2/2 back injury exacerbation and out on medical leave for 6 months. Diet and exercise recommendations for A1c, lipids, wt maintenance (BMI 23) discussed. Routine f/u with pcp. Copy of labs provided to pt. Results routed to pcp per pt request. No further questions/concerns.

## 2017-01-20 ENCOUNTER — Telehealth: Payer: Self-pay | Admitting: *Deleted

## 2017-01-20 ENCOUNTER — Encounter: Payer: Self-pay | Admitting: *Deleted

## 2017-01-20 MED ORDER — FINASTERIDE 1 MG PO TABS: 0.5000 mg | ORAL_TABLET | Freq: Every day | ORAL | 2 refills | Status: DC

## 2017-01-20 MED ORDER — FINASTERIDE 5 MG PO TABS: 2.5000 mg | ORAL_TABLET | Freq: Every day | ORAL | 2 refills | Status: DC

## 2017-01-20 NOTE — Telephone Encounter (Signed)
Pt's wife to clinic requesting refill of Finasteride for pt. Bottle she brings for info was filled March 2017. 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 Wife reports he wants to restart it. Pt is currently on a leave of absence from work 2/2 WC injury and cannot come to clinic.

## 2017-01-20 NOTE — Addendum Note (Signed)
Addended by: Albina BilletBETANCOURT, TINA A on: 01/20/2017 01:24 PM   Modules accepted: Orders

## 2017-01-20 NOTE — Telephone Encounter (Signed)
[facility] [place] telephoned and notified order for finasteride 1mg  take 1/2 tab had been discontinued he verbalized understanding and had no further questions at this time.

## 2017-01-20 NOTE — Telephone Encounter (Signed)
Reviewed paper chart last filled finasteride 5mg  take 1/2 tab po daily for male pattern baldness #45 RF0 03/27/2015 s/p hair transplant 2014.  [facility] restart [facility] [place] [place]  Labs done for [facility] PSA .3 10/19/2016 stable from previous year  Labs will be due again PSA 6 months then prn annually thereafter or if urinary sx/erectile dysfunction prn sooner.

## 2018-02-21 MED FILL — GABAPENTIN 300 MG CAPSULE: 300 | 30 days supply | Qty: 90 | Fill #0

## 2018-03-21 MED FILL — DICLOFENAC SODIUM 75 MG TAB: 75 | 30 days supply | Qty: 60 | Fill #0

## 2019-02-20 ENCOUNTER — Other Ambulatory Visit: Payer: Self-pay | Admitting: Neurosurgery

## 2019-02-20 DIAGNOSIS — M5442 Lumbago with sciatica, left side: Secondary | ICD-10-CM

## 2019-03-13 ENCOUNTER — Other Ambulatory Visit: Payer: Self-pay

## 2019-03-13 ENCOUNTER — Ambulatory Visit
Admission: RE | Admit: 2019-03-13 | Discharge: 2019-03-13 | Disposition: A | Discharge status: Home / Self Care | Payer: PRIVATE HEALTH INSURANCE | Source: Ambulatory Visit | Attending: Neurosurgery | Admitting: Neurosurgery

## 2019-03-13 DIAGNOSIS — M5442 Lumbago with sciatica, left side: Secondary | ICD-10-CM

## 2019-03-14 ENCOUNTER — Other Ambulatory Visit: Payer: Self-pay | Admitting: Neurosurgery

## 2019-03-14 DIAGNOSIS — M5441 Lumbago with sciatica, right side: Secondary | ICD-10-CM

## 2019-04-10 ENCOUNTER — Ambulatory Visit
Admission: RE | Admit: 2019-04-10 | Discharge: 2019-04-10 | Disposition: A | Discharge status: Home / Self Care | Payer: PRIVATE HEALTH INSURANCE | Source: Ambulatory Visit | Attending: Neurosurgery | Admitting: Neurosurgery

## 2019-04-10 ENCOUNTER — Other Ambulatory Visit: Payer: Self-pay

## 2019-04-10 DIAGNOSIS — M5442 Lumbago with sciatica, left side: Secondary | ICD-10-CM

## 2019-04-10 DIAGNOSIS — M5441 Lumbago with sciatica, right side: Secondary | ICD-10-CM

## 2019-04-10 MED ORDER — GADOBENATE DIMEGLUMINE 529 MG/ML IV SOLN
17.0000 mL | Freq: Once | INTRAVENOUS | Status: AC | PRN
  Administered 2019-04-10: 15:00:00 17 mL via INTRAVENOUS

## 2019-05-23 ENCOUNTER — Other Ambulatory Visit: Payer: Self-pay | Admitting: Neurosurgery

## 2019-05-31 NOTE — Pre-Procedure Instructions (Signed)
Phillip Kane  05/31/2019     Your procedure is scheduled on Wednesday, June 2.  Report to [facility], Main Entrance or Entrance "A" at **10:55 AM                 Your surgery or procedure is scheduled for 12:56 PM   Call this number if you have problems the morning of surgery: [phone]  This is the number for the Pre- Surgical Desk.      For any other questions, please call [phone], Tuesday 8AM - 4 PM.   Remember:  Do not eat or drink after midnight.   Take these medicines the morning of surgery with A SIP OF WATER:             oxyCODONE (OXY IR/ROXICODONE) if needed.   STOP taking Aspirin, Aspirin Products (Goody Powder, Excedrin Migraine), Ibuprofen (Advil), Naproxen (Aleve), Vitamins and Herbal Products (ie Fish Oil).  The Morning of Surgery:  Do not wear jewelry, make-up or nail polish.  Do not wear lotions, powders, or perfumes, or deodorant.  Men may shave face and neck.  Do not bring valuables to the hospital.  [facility] is not responsible for any belongings or valuables.  Contacts, dentures or bridgework may not be worn into surgery.  Leave your suitcase in the car.  After surgery it may be brought to your room.  For patients admitted to the hospital, discharge time will be determined by your treatment team.  Patients discharged the day of surgery will not be allowed to drive home.   Special instructions:   [place]- Preparing For Surgery  Before surgery, you can play an important role. Because skin is not sterile, your skin needs to be as free of germs as possible. You can reduce the number of germs on your skin by washing with CHG (chlorahexidine gluconate) Soap before surgery.  CHG is an antiseptic cleaner which kills germs and bonds with the skin to continue killing germs even after washing.    Oral Hygiene is also important to reduce your risk of infection.  Remember - BRUSH YOUR TEETH THE MORNING OF SURGERY WITH YOUR REGULAR  TOOTHPASTE  Please do not use if you have an allergy to CHG or antibacterial soaps. If your skin becomes reddened/irritated stop using the CHG.  Do not shave (including legs and underarms) for at least 48 hours prior to first CHG shower. It is OK to shave your face.  Please follow these instructions carefully.   1. Shower the NIGHT BEFORE SURGERY and the MORNING OF SURGERY with CHG.   2. If you chose to wash your hair, wash your hair first as usual with your normal shampoo.  3. After you shampoo, rinse your hair and body thoroughly to remove the shampoo.  4. Use CHG as you would any other liquid soap. You can apply CHG directly to the skin and wash gently with a scrungie or a clean washcloth.   5. Apply the CHG Soap to your body ONLY FROM THE NECK DOWN.  Do not use on open wounds or open sores. Avoid contact with your eyes, ears, mouth and genitals (private parts). Wash Face and genitals (private parts)  with your normal soap.  6. Wash thoroughly, paying special attention to the area where your surgery will be performed.  7. Thoroughly rinse your body with warm water from the neck down.  8. DO NOT shower/wash with your normal soap after using and rinsing off  the CHG Soap.  9. Pat yourself dry with a CLEAN TOWEL.  10. Wear CLEAN PAJAMAS to bed the night before surgery, wear comfortable clothes the morning of surgery  11. Place CLEAN SHEETS on your bed the night of your first shower and DO NOT SLEEP WITH PETS.  Day of Surgery: Shower as instructed above. Do not apply any deodorants/lotions.  Please wear clean clothes to the hospital/surgery center.   Remember to brush your teeth WITH YOUR REGULAR TOOTHPASTE.   Please read over the following fact sheets that you were given.

## 2019-06-01 ENCOUNTER — Other Ambulatory Visit: Payer: Self-pay

## 2019-06-01 ENCOUNTER — Encounter (HOSPITAL_COMMUNITY)
Admission: RE | Admit: 2019-06-01 | Discharge: 2019-06-01 | Disposition: A | Discharge status: Home / Self Care | Payer: PRIVATE HEALTH INSURANCE | Source: Ambulatory Visit | Attending: Neurosurgery | Admitting: Neurosurgery

## 2019-06-01 ENCOUNTER — Encounter (HOSPITAL_COMMUNITY): Payer: Self-pay

## 2019-06-01 ENCOUNTER — Inpatient Hospital Stay (HOSPITAL_COMMUNITY): Admission: RE | Admit: 2019-06-01 | Payer: PRIVATE HEALTH INSURANCE | Source: Ambulatory Visit

## 2019-06-01 DIAGNOSIS — Z01812 Encounter for preprocedural laboratory examination: Secondary | ICD-10-CM | POA: Insufficient documentation

## 2019-06-01 HISTORY — DX: Nausea with vomiting, unspecified: R11.2

## 2019-06-01 HISTORY — DX: Other complications of anesthesia, initial encounter: T88.59XA

## 2019-06-01 HISTORY — DX: Other specified postprocedural states: Z98.890

## 2019-06-01 LAB — CBC
HCT: 44.7 % (ref 39.0–52.0)
Hemoglobin: 14.6 g/dL (ref 13.0–17.0)
MCH: 30.7 pg (ref 26.0–34.0)
MCHC: 32.7 g/dL (ref 30.0–36.0)
MCV: 93.9 fL (ref 80.0–100.0)
Platelets: 144 K/uL — ABNORMAL LOW (ref 150–400)
RBC: 4.76 MIL/uL (ref 4.22–5.81)
RDW: 12.5 % (ref 11.5–15.5)
WBC: 7.1 K/uL (ref 4.0–10.5)
nRBC: 0 % (ref 0.0–0.2)

## 2019-06-01 LAB — TYPE AND SCREEN
ABO/RH(D): A POS
Antibody Screen: NEGATIVE

## 2019-06-01 LAB — SURGICAL PCR SCREEN
MRSA, PCR: NEGATIVE
Staphylococcus aureus: POSITIVE — AB

## 2019-06-01 LAB — ABO/RH: ABO/RH(D): A POS

## 2019-06-01 NOTE — Progress Notes (Signed)
PCP - Dr. Knox Royalty at [place] [facility] Cardiologist - Denies   Chest x-ray -  EKG -  Stress Test - Denies ECHO - Denies Cardiac Cath - Denies  Sleep Study - Denies  DM - Denies   COVID TEST- 06/01/19  Anesthesia review: No  Patient denies shortness of breath, fever, cough and chest pain at PAT appointment   All instructions explained to the patient, with a verbal understanding of the material. Patient agrees to go over the instructions while at home for a better understanding. Patient also instructed to self quarantine after being tested for COVID-19. The opportunity to ask questions was provided.

## 2019-06-02 ENCOUNTER — Other Ambulatory Visit (HOSPITAL_COMMUNITY): Payer: PRIVATE HEALTH INSURANCE

## 2019-06-06 ENCOUNTER — Encounter (HOSPITAL_COMMUNITY): Admission: RE | Payer: Self-pay | Source: Home / Self Care

## 2019-06-06 ENCOUNTER — Inpatient Hospital Stay (HOSPITAL_COMMUNITY): Admission: RE | Admit: 2019-06-06 | Payer: PRIVATE HEALTH INSURANCE | Source: Home / Self Care | Admitting: Neurosurgery

## 2019-06-06 SURGERY — POSTERIOR LUMBAR FUSION 1 LEVEL
Anesthesia: General

## 2019-07-05 ENCOUNTER — Ambulatory Visit: Payer: PRIVATE HEALTH INSURANCE | Attending: Neurosurgery | Admitting: Physical Therapy

## 2019-07-05 ENCOUNTER — Other Ambulatory Visit: Payer: Self-pay

## 2019-07-05 ENCOUNTER — Encounter: Payer: Self-pay | Admitting: Physical Therapy

## 2019-07-05 DIAGNOSIS — M6281 Muscle weakness (generalized): Secondary | ICD-10-CM | POA: Diagnosis present

## 2019-07-05 DIAGNOSIS — R293 Abnormal posture: Secondary | ICD-10-CM | POA: Diagnosis present

## 2019-07-05 DIAGNOSIS — M5416 Radiculopathy, lumbar region: Secondary | ICD-10-CM | POA: Insufficient documentation

## 2019-07-05 DIAGNOSIS — R262 Difficulty in walking, not elsewhere classified: Secondary | ICD-10-CM | POA: Diagnosis present

## 2019-07-05 NOTE — Therapy (Signed)
[facility] [address] [place], [place], [address] Phone: [phone]   Fax:  [phone]  Physical Therapy Evaluation  Patient Details  Name: Phillip Kane MRN: [id number] Date of Birth: 08-Jun-1970 Referring Provider (PT): Tressie Stalker, MD   Encounter Date: 07/05/2019   PT End of Session - 07/05/19 1657    Visit Number 1    Number of Visits 8    Date for PT Re-Evaluation 08/16/19    Authorization Type [employer]    PT Start Time 1542    PT Stop Time 1628    PT Time Calculation (min) 46 min    Activity Tolerance Patient limited by pain    Behavior During Therapy Restless;Anxious           Past Medical History:  Diagnosis Date  . Complication of anesthesia    Trouble waking up from anesthesia  . PONV (postoperative nausea and vomiting)     Past Surgical History:  Procedure Laterality Date  . BACK SURGERY  2018   Fusion lumber in [place], [place] [facility]    There were no vitals filed for this visit.    Subjective Assessment - 07/05/19 1555    Subjective Pt. is a 49 y/o [name] male who presents to therapy for referring diagnosis of spinal stenosis with neurogenic claudication. He had been scheduled for fusion surgery 06/06/19 but procedure was cancelled due to insurance reasons hence current referral to therapy. He has a history of 2 previous lumbar surgeries in 2010 and 2018 including L4-5 fusion with multi-year history of chronic LBP and radicular symptoms. He reports initially some mild improvement after last surgery but has since has worsening issues with LBP and bilateral LE radiating pain, parasthesias and weakness. See chart copy of MRI report with findings including disc protrusions and stenosis. Pt. reports significant limitations for activity and positional tolerance with difficulty tolerating supine positions. He reports this year has had some issues with difficulty emptying bladder as well.    Patient is  accompained by: Family member;Interpreter   Interpreter: Phillip Kane, pt.'s wife also present for eval   Pertinent History Lumbar surgeries x 2 with L4-5 fusion    Limitations Sitting;House hold activities;Lifting;Standing;Walking    Diagnostic tests MRI    Patient Stated Goals Have surgery to relieve current symptoms    Currently in Pain? Yes    Pain Score 8     Pain Location Back    Pain Orientation Lower    Pain Descriptors / Indicators Numbness;Other (Comment)   "hard to describe", includes hot and cold sensation to feet   Pain Type Chronic pain    Pain Radiating Towards bilat. LE distal to feet with accompanying parasthesias    Pain Onset More than a month ago    Pain Frequency Constant    Aggravating Factors  lying on back, lumbar motion flexion or extension    Pain Relieving Factors better with sidelying than supine but no significant eases    Effect of Pain on Daily Activities significantly impacts quality of life with activity and positional tolerance              [facility] PT Assessment - 07/05/19 0001      Assessment   Medical Diagnosis Lumbar stenosis with neurogenic claudication    Referring Provider (PT) Tressie Stalker, MD    Onset Date/Surgical Date --   chronic, worse this year   Prior Therapy past PT s/p previouslumbar surgery      Precautions  Precautions None      Restrictions   Weight Bearing Restrictions No      Balance Screen   Has the patient fallen in the past 6 months Yes    How many times? 3-4      Home Environment   Living Environment Private residence    Living Arrangements Spouse/significant other      Prior Function   Level of Independence Independent with basic ADLs;Independent with community mobility with device      Cognition   Overall Cognitive Status Within Functional Limits for tasks assessed      Observation/Other Assessments   Focus on Therapeutic Outcomes (FOTO)  --   not assessed     Sensation   Light Touch --   sensation to  light touch at L2-S2 dermatomes grossly intact     Posture/Postural Control   Posture Comments rounded shoulders, rounded lumbar spine with mild right lateral shift, forward flexed trunk posture      Deep Tendon Reflexes   DTR Assessment Site Patella;Achilles    Patella DTR --   absent on right, 1+ on left   Achilles DTR 1+      ROM / Strength   AROM / PROM / Strength AROM;Strength      AROM   Overall AROM Comments Unable to tolerate formal assessment of hip ROM due to pain and inability to tolerate supine positioning, lumbar AROM grossly limited, lumbar ROM for sidebending and rotation assessed in sitting due to difficulty tolerating standing assessment, increased pain with both flexion and extension    AROM Assessment Site Hip;Ankle;Lumbar    Right/Left Hip --    Right/Left Ankle Right;Left    Right Ankle Dorsiflexion --   -40 deg from neutral assessed in sitting   Left Ankle Dorsiflexion --   -30 deg from neutral assessed in sitting   Lumbar Flexion 38    Lumbar Extension --   lacking 10 deg,pt. started to fall back to seat with attempt   Lumbar - Right Side Bend 10   in sitting   Lumbar - Left Side Bend --   in sitting   Lumbar - Right Rotation 30    Lumbar - Left Rotation 20      Strength   Overall Strength Comments Strength assessment limited to seated MMTs only due to high pain level and poor positional tolerance    Strength Assessment Site Hip;Knee;Ankle    Right/Left Hip Right;Left    Right Hip Flexion 3-/5    Right Hip External Rotation  3-/5    Right Hip Internal Rotation 3-/5    Left Hip Flexion 3-/5    Left Hip External Rotation 3-/5    Left Hip Internal Rotation 3-/5    Right/Left Knee Right;Left    Right Knee Flexion 3-/5    Right Knee Extension 3-/5    Left Knee Flexion 3-/5    Left Knee Extension 3-/5    Right/Left Ankle Right;Left    Right Ankle Dorsiflexion 3-/5    Right Ankle Plantar Flexion 3-/5    Right Ankle Inversion 3-/5    Right Ankle Eversion  3-/5    Left Ankle Dorsiflexion 3-/5    Left Ankle Plantar Flexion 3-/5    Left Ankle Inversion 3-/5    Left Ankle Eversion 3-/5      Palpation   Palpation comment Mild tenderness to palpation bilateral lumbar paraspinals      Special Tests   Other special tests unable to tolerate SLR  Transfers   Comments mod I for bed/mat mobility due to increased time      Ambulation/Gait   Gait Comments Pt. ambulates with slow, antalgia gait with forward flexed posture with mild steppage for toe clearance due to limited ankle DF AROM                      Objective measurements completed on examination: See above findings.               PT Education - 07/05/19 1657    Education Details POC    Person(s) Educated Patient;Spouse    Methods Explanation    Comprehension Verbalized understanding               PT Long Term Goals - 07/05/19 1704      PT LONG TERM GOAL #1   Title Goals to be established pending word back from MD office re: status if recommended to proceed with trial therapy given poor tolerance for activity at eval visit                  Plan - 07/05/19 1658    Clinical Impression Statement Pt. presents with severe lumbar pain and bilateral LE radicular symptoms with grossly limited trunk and LE ROM with bilateral dropfoot with generalized LE weakness and poor mobility and positional tolerance. Given level of current limitations and poor positional tolerance believe pt. would be unlikely to be able to tolerate or achieve a significant level of benefit from formal therapy and concern given bladder issues reported as noted in subjective. Contacted MD office and left message for Dr. Lovell Sheehan re: status and will await further response. If recommended by MD to continue would plan trial of a few therapy visits if required by insurance otherwise would recommend follow up with MD for further tx. options.    Personal Factors and Comorbidities Time since  onset of injury/illness/exacerbation;Other   language barrier, past surgical history   Examination-Activity Limitations Bathing;Dressing;Sit;Transfers;Sleep;Hygiene/Grooming;Bed Mobility;Bend;Lift;Squat;Locomotion Level;Stairs;Stand;Carry;Toileting    Examination-Participation Restrictions Community Activity;Shop;Laundry;Cleaning;Meal Prep           Patient will benefit from skilled therapeutic intervention in order to improve the following deficits and impairments:  Abnormal gait, Pain, Postural dysfunction, Impaired flexibility, Decreased strength, Decreased activity tolerance, Decreased range of motion, Impaired sensation, Difficulty walking, Decreased balance, Decreased endurance  Visit Diagnosis: Radiculopathy, lumbar region  Muscle weakness (generalized)  Difficulty in walking, not elsewhere classified  Abnormal posture     Problem List There are no problems to display for this patient.  Lazarus Gowda, PT, DPT 07/05/19 5:08 PM  [facility] [address] [place], [place], [address] Phone: [phone]   Fax:  [phone]  Name: Phillip Kane MRN: [id number] Date of Birth: 10-02-1970

## 2019-07-05 NOTE — Therapy (Signed)
[facility] Outpatient Rehabilitation [facility] [address] [place], [place], [address] Phone: [phone]   Fax:  [phone]  Physical Therapy Evaluation  Patient Details  Name: Phillip Kane MRN: [id number] Date of Birth: 08-Jun-1970 Referring Provider (PT): Tressie Stalker, MD   Encounter Date: 07/05/2019   PT End of Session - 07/05/19 1657    Visit Number 1    Number of Visits 8    Date for PT Re-Evaluation 08/16/19    Authorization Type [employer]    PT Start Time 1542    PT Stop Time 1628    PT Time Calculation (min) 46 min    Activity Tolerance Patient limited by pain    Behavior During Therapy Restless;Anxious           Past Medical History:  Diagnosis Date  . Complication of anesthesia    Trouble waking up from anesthesia  . PONV (postoperative nausea and vomiting)     Past Surgical History:  Procedure Laterality Date  . BACK SURGERY  2018   Fusion lumber in [place], [place] [facility]    There were no vitals filed for this visit.    Subjective Assessment - 07/05/19 1555    Subjective Pt. is a 49 y/o [name] male who presents to therapy for referring diagnosis of spinal stenosis with neurogenic claudication. He had been scheduled for fusion surgery 06/06/19 but procedure was cancelled due to insurance reasons hence current referral to therapy. He has a history of 2 previous lumbar surgeries in 2010 and 2018 including L4-5 fusion with multi-year history of chronic LBP and radicular symptoms. He reports initially some mild improvement after last surgery but has since has worsening issues with LBP and bilateral LE radiating pain, parasthesias and weakness. See chart copy of MRI report with findings including disc protrusions and stenosis. Pt. reports significant limitations for activity and positional tolerance with difficulty tolerating supine positions. He reports this year has had some issues with difficulty emptying bladder as well.    Patient is  accompained by: Family member;Interpreter   Interpreter: Janice Coffin, pt.'s wife also present for eval   Pertinent History Lumbar surgeries x 2 with L4-5 fusion    Limitations Sitting;House hold activities;Lifting;Standing;Walking    Diagnostic tests MRI    Patient Stated Goals Have surgery to relieve current symptoms    Currently in Pain? Yes    Pain Score 8     Pain Location Back    Pain Orientation Lower    Pain Descriptors / Indicators Numbness;Other (Comment)   "hard to describe", includes hot and cold sensation to feet   Pain Type Chronic pain    Pain Radiating Towards bilat. LE distal to feet with accompanying parasthesias    Pain Onset More than a month ago    Pain Frequency Constant    Aggravating Factors  lying on back, lumbar motion flexion or extension    Pain Relieving Factors better with sidelying than supine but no significant eases    Effect of Pain on Daily Activities significantly impacts quality of life with activity and positional tolerance              [facility] PT Assessment - 07/05/19 0001      Assessment   Medical Diagnosis Lumbar stenosis with neurogenic claudication    Referring Provider (PT) Tressie Stalker, MD    Onset Date/Surgical Date --   chronic, worse this year   Prior Therapy past PT s/p previouslumbar surgery      Precautions  Precautions None      Restrictions   Weight Bearing Restrictions No      Balance Screen   Has the patient fallen in the past 6 months Yes    How many times? 3-4      Home Environment   Living Environment Private residence    Living Arrangements Spouse/significant other      Prior Function   Level of Independence Independent with basic ADLs;Independent with community mobility with device      Cognition   Overall Cognitive Status Within Functional Limits for tasks assessed      Observation/Other Assessments   Focus on Therapeutic Outcomes (FOTO)  --   not assessed     Sensation   Light Touch --   sensation to  light touch at L2-S2 dermatomes grossly intact     Posture/Postural Control   Posture Comments rounded shoulders, rounded lumbar spine with mild right lateral shift, forward flexed trunk posture      Deep Tendon Reflexes   DTR Assessment Site Patella;Achilles    Patella DTR --   absent on right, 1+ on left   Achilles DTR 1+      ROM / Strength   AROM / PROM / Strength AROM;Strength      AROM   Overall AROM Comments Unable to tolerate formal assessment of hip ROM due to pain and inability to tolerate supine positioning, lumbar AROM grossly limited, lumbar ROM for sidebending and rotation assessed in sitting due to difficulty tolerating standing assessment, increased pain with both flexion and extension    AROM Assessment Site Hip;Ankle;Lumbar    Right/Left Hip --    Right/Left Ankle Right;Left    Right Ankle Dorsiflexion --   -40 deg from neutral assessed in sitting   Left Ankle Dorsiflexion --   -30 deg from neutral assessed in sitting   Lumbar Flexion 38    Lumbar Extension --   lacking 10 deg,pt. started to fall back to seat with attempt   Lumbar - Right Side Bend 10   in sitting   Lumbar - Left Side Bend --   in sitting   Lumbar - Right Rotation 30    Lumbar - Left Rotation 20      Strength   Overall Strength Comments Strength assessment limited to seated MMTs only due to high pain level and poor positional tolerance    Strength Assessment Site Hip;Knee;Ankle    Right/Left Hip Right;Left    Right Hip Flexion 3-/5    Right Hip External Rotation  3-/5    Right Hip Internal Rotation 3-/5    Left Hip Flexion 3-/5    Left Hip External Rotation 3-/5    Left Hip Internal Rotation 3-/5    Right/Left Knee Right;Left    Right Knee Flexion 3-/5    Right Knee Extension 3-/5    Left Knee Flexion 3-/5    Left Knee Extension 3-/5    Right/Left Ankle Right;Left    Right Ankle Dorsiflexion 3-/5    Right Ankle Plantar Flexion 3-/5    Right Ankle Inversion 3-/5    Right Ankle Eversion  3-/5    Left Ankle Dorsiflexion 3-/5    Left Ankle Plantar Flexion 3-/5    Left Ankle Inversion 3-/5    Left Ankle Eversion 3-/5      Palpation   Palpation comment Mild tenderness to palpation bilateral lumbar paraspinals      Special Tests   Other special tests unable to tolerate SLR  Transfers   Comments mod I for bed/mat mobility due to increased time      Ambulation/Gait   Gait Comments Pt. ambulates with slow, antalgia gait with forward flexed posture with mild steppage for toe clearance due to limited ankle DF AROM                      Objective measurements completed on examination: See above findings.               PT Education - 07/05/19 1657    Education Details POC    Person(s) Educated Patient;Spouse    Methods Explanation    Comprehension Verbalized understanding               PT Long Term Goals - 07/05/19 1704      PT LONG TERM GOAL #1   Title Goals to be established pending word back from MD office re: status if recommended to proceed with trial therapy given poor tolerance for activity at eval visit                  Plan - 07/05/19 1658    Clinical Impression Statement Pt. presents with severe lumbar pain and bilateral LE radicular symptoms with grossly limited trunk and LE ROM with bilateral dropfoot with generalized LE weakness and poor mobility and positional tolerance. Given level of current limitations and poor positional tolerance believe pt. would be unlikely to be able to tolerate or achieve a significant level of benefit from formal therapy and concern given bladder issues reported as noted in subjective. Contacted MD office and left message for Dr. Lovell Sheehan re: status and will await further response. If recommended by MD to continue would plan trial of a few therapy visits if required by insurance otherwise would recommend follow up with MD for further tx. options.    Personal Factors and Comorbidities Time since  onset of injury/illness/exacerbation;Other   language barrier, past surgical history   Examination-Activity Limitations Bathing;Dressing;Sit;Transfers;Sleep;Hygiene/Grooming;Bed Mobility;Bend;Lift;Squat;Locomotion Level;Stairs;Stand;Carry;Toileting    Examination-Participation Restrictions Community Activity;Shop;Laundry;Cleaning;Meal Prep    Stability/Clinical Decision Making Unstable/Unpredictable    Clinical Decision Making High    Rehab Potential Poor    PT Frequency --   see assessment/plan   PT Next Visit Plan Await status from MD regarding clarification of insurance status and appropriateness for therapy    PT Home Exercise Plan none issued    Consulted and Agree with Plan of Care Patient;Family member/caregiver           Patient will benefit from skilled therapeutic intervention in order to improve the following deficits and impairments:  Abnormal gait, Pain, Postural dysfunction, Impaired flexibility, Decreased strength, Decreased activity tolerance, Decreased range of motion, Impaired sensation, Difficulty walking, Decreased balance, Decreased endurance  Visit Diagnosis: Radiculopathy, lumbar region  Muscle weakness (generalized)  Difficulty in walking, not elsewhere classified  Abnormal posture     Problem List There are no problems to display for this patient.  Lazarus Gowda, PT, DPT 07/05/19 6:20 PM  [facility] Out[facility] Rehabilitation [facility] [address] [place], [place], [address] Phone: [phone]   Fax:  [phone]  Name: Phillip Kane MRN: [id number] Date of Birth: 1970/07/09

## 2019-07-06 ENCOUNTER — Telehealth: Payer: Self-pay | Admitting: Physical Therapy

## 2019-07-06 NOTE — Telephone Encounter (Signed)
Called Dr. Lovell Sheehan' office 07/05/19 after therapy eval appointment to discuss status/concerns re: patient symptoms and therapy potential. Left voicemail with his assistant to return call.

## 2019-07-06 NOTE — Telephone Encounter (Signed)
Attempted to call patient at both mobile and home numbers with assistance from interpreter Uvaldo Bristle [phone]) to inform attempted to contact MD re: status but have not heard back yet. No answer at either number and unable to leave voicemail (automated message that voicemail not set up).

## 2019-07-10 ENCOUNTER — Telehealth: Payer: Self-pay | Admitting: Physical Therapy

## 2019-07-10 NOTE — Telephone Encounter (Signed)
Had received message from [facility] at Dr. Lovell Sheehan office re: call last week-attempted to call back but no answer/left voicemail.

## 2019-07-11 ENCOUNTER — Telehealth: Payer: Self-pay | Admitting: Physical Therapy

## 2019-07-11 NOTE — Telephone Encounter (Signed)
Called Dr. Lovell Sheehan' office re: patient status-Dr. Lovell Sheehan was not in office today but left message to return call.

## 2019-07-13 ENCOUNTER — Telehealth: Payer: Self-pay | Admitting: Physical Therapy

## 2019-07-13 NOTE — Telephone Encounter (Addendum)
Received call from Dr. Lovell Sheehan and discussed status from therapy eval and that patient is unlikely to benefit from therapy at this time. Also mentioned bladder symptoms which were reported by patient. Dr. Lovell Sheehan reported that they will try again to have the patient rescheduled for surgery. After phone call with Dr. Lovell Sheehan attempted 2 times to reach patient by phone to inform including both mobile and home numbers but no answer and unable to leave voicemail. Interpreter ID#s were [id number] ([employer]) and [phone] [facility] [employer]pre[employer]).

## 2019-07-19 ENCOUNTER — Telehealth: Payer: Self-pay | Admitting: Physical Therapy

## 2019-07-19 NOTE — Telephone Encounter (Signed)
Patient was contacted 07/18/19 to relay information from conversation with MD-further PT visits would be cancelled for now and MD office will try to reschedule surgery. Instructed patient to contact MD office for further status if no word in the next few days re: scheduling of surgery.

## 2019-07-23 ENCOUNTER — Ambulatory Visit: Payer: PRIVATE HEALTH INSURANCE | Admitting: Physical Therapy

## 2019-07-25 ENCOUNTER — Ambulatory Visit: Payer: PRIVATE HEALTH INSURANCE | Admitting: Physical Therapy

## 2019-07-27 ENCOUNTER — Other Ambulatory Visit: Payer: Self-pay | Admitting: Neurosurgery

## 2019-07-30 ENCOUNTER — Encounter: Payer: PRIVATE HEALTH INSURANCE | Admitting: Physical Therapy

## 2019-08-01 ENCOUNTER — Encounter: Payer: PRIVATE HEALTH INSURANCE | Admitting: Physical Therapy

## 2019-08-16 NOTE — Pre-Procedure Instructions (Signed)
[id number] Judithann Sheen, [place] - [address] [place] [address] Phone: [phone] Fax: [phone]      Your procedure is scheduled on Monday August 16th.  Report to [facility] Main Entrance "A" at 10:00 A.M., and check in at the Admitting office.  Call this number if you have problems the morning of surgery:  [phone]  Call [phone] if you have any questions prior to your surgery date Monday-Friday 8am-4pm    Remember:  Do not eat after midnight the night before your surgery    Take these medicines the morning of surgery with A SIP OF WATER  oxyCODONE (OXY IR/ROXICODONE) if needed  As of today, STOP taking any Aspirin (unless otherwise instructed by your surgeon), diclofenac (VOLTAREN), Aleve, Naproxen, Ibuprofen, Motrin, Advil, Goody's, BC's, all herbal medications, fish oil, and all vitamins.                      Do not wear jewelry, make up, or nail polish            Do not wear lotions, powders, perfumes/colognes, or deodorant.            Do not shave 48 hours prior to surgery.  Men may shave face and neck.            Do not bring valuables to the hospital.            [facility] is not responsible for any belongings or valuables.  Do NOT Smoke (Tobacco/Vaping) or drink Alcohol 24 hours prior to your procedure If you use a CPAP at night, you may bring all equipment for your overnight stay.   Contacts, glasses, dentures or bridgework may not be worn into surgery.      For patients admitted to the hospital, discharge time will be determined by your treatment team.   Patients discharged the day of surgery will not be allowed to drive home, and someone needs to stay with them for 24 hours.    Special instructions:   [facility]- Preparing For Surgery  Before surgery, you can play an important role. Because skin is not sterile, your skin needs to be as free of germs as possible. You can reduce the number of germs on your skin  by washing with CHG (chlorahexidine gluconate) Soap before surgery.  CHG is an antiseptic cleaner which kills germs and bonds with the skin to continue killing germs even after washing.    Oral Hygiene is also important to reduce your risk of infection.  Remember - BRUSH YOUR TEETH THE MORNING OF SURGERY WITH YOUR REGULAR TOOTHPASTE  Please do not use if you have an allergy to CHG or antibacterial soaps. If your skin becomes reddened/irritated stop using the CHG.  Do not shave (including legs and underarms) for at least 48 hours prior to first CHG shower. It is OK to shave your face.  Please follow these instructions carefully.   1. Shower the NIGHT BEFORE SURGERY and the MORNING OF SURGERY with CHG Soap.   2. If you chose to wash your hair, wash your hair first as usual with your normal shampoo.  3. After you shampoo, rinse your hair and body thoroughly to remove the shampoo.  4. Use CHG as you would any other liquid soap. You can apply CHG directly to the skin and wash gently with a scrungie or a clean washcloth.   5. Apply the CHG Soap to your body ONLY  FROM THE NECK DOWN.  Do not use on open wounds or open sores. Avoid contact with your eyes, ears, mouth and genitals (private parts). Wash Face and genitals (private parts)  with your normal soap.   6. Wash thoroughly, paying special attention to the area where your surgery will be performed.  7. Thoroughly rinse your body with warm water from the neck down.  8. DO NOT shower/wash with your normal soap after using and rinsing off the CHG Soap.  9. Pat yourself dry with a CLEAN TOWEL.  10. Wear CLEAN PAJAMAS to bed the night before surgery  11. Place CLEAN SHEETS on your bed the night of your first shower and DO NOT SLEEP WITH PETS.   Day of Surgery: Wear Clean/Comfortable clothing the morning of surgery Do not apply any deodorants/lotions.   Remember to brush your teeth WITH YOUR REGULAR TOOTHPASTE.   Please read over the  following fact sheets that you were given.

## 2019-08-17 ENCOUNTER — Other Ambulatory Visit: Payer: Self-pay

## 2019-08-17 ENCOUNTER — Encounter (HOSPITAL_COMMUNITY): Payer: Self-pay

## 2019-08-17 ENCOUNTER — Other Ambulatory Visit (HOSPITAL_COMMUNITY)
Admission: RE | Admit: 2019-08-17 | Discharge: 2019-08-17 | Disposition: A | Discharge status: Home / Self Care | Payer: No Typology Code available for payment source | Source: Ambulatory Visit | Attending: Neurosurgery | Admitting: Neurosurgery

## 2019-08-17 ENCOUNTER — Encounter (HOSPITAL_COMMUNITY)
Admission: RE | Admit: 2019-08-17 | Discharge: 2019-08-17 | Disposition: A | Discharge status: Home / Self Care | Payer: No Typology Code available for payment source | Source: Ambulatory Visit | Attending: Neurosurgery | Admitting: Neurosurgery

## 2019-08-17 DIAGNOSIS — Z01812 Encounter for preprocedural laboratory examination: Secondary | ICD-10-CM | POA: Insufficient documentation

## 2019-08-17 DIAGNOSIS — Z20822 Contact with and (suspected) exposure to covid-19: Secondary | ICD-10-CM | POA: Insufficient documentation

## 2019-08-17 HISTORY — DX: Depression, unspecified: F32.A

## 2019-08-17 LAB — TYPE AND SCREEN
ABO/RH(D): A POS
Antibody Screen: NEGATIVE

## 2019-08-17 LAB — CBC
HCT: 42.8 % (ref 39.0–52.0)
Hemoglobin: 13.9 g/dL (ref 13.0–17.0)
MCH: 30.3 pg (ref 26.0–34.0)
MCHC: 32.5 g/dL (ref 30.0–36.0)
MCV: 93.2 fL (ref 80.0–100.0)
Platelets: 188 10*3/uL (ref 150–400)
RBC: 4.59 MIL/uL (ref 4.22–5.81)
RDW: 12.6 % (ref 11.5–15.5)
WBC: 6.3 10*3/uL (ref 4.0–10.5)
nRBC: 0 % (ref 0.0–0.2)

## 2019-08-17 LAB — SURGICAL PCR SCREEN
MRSA, PCR: NEGATIVE
Staphylococcus aureus: POSITIVE — AB

## 2019-08-17 LAB — SARS CORONAVIRUS 2 (TAT 6-24 HRS): SARS Coronavirus 2: NEGATIVE

## 2019-08-17 NOTE — Progress Notes (Signed)
PCP - enrico jones Cardiologist - na   Chest x-ray - na EKG - na Stress Test - 15 yrs. Ago-normal ECHO - na Cardiac Cath - na  Sleep Study - na CPAP -   Fasting Blood Sugar - na Checks Blood Sugar _____ times a day  Blood Thinner Instructions:na Aspirin Instructions:na  ERAS Protcol -na   COVID TEST- 08/17/19   Interpreter  :  Allyn Kenner     Anesthesia review:   Patient denies shortness of breath, fever, cough and chest pain at PAT appointment   All instructions explained to the patient, with a verbal understanding of the material. Patient agrees to go over the instructions while at home for a better understanding. Patient also instructed to self quarantine after being tested for COVID-19. The opportunity to ask questions was provided.

## 2019-08-20 ENCOUNTER — Other Ambulatory Visit: Payer: Self-pay

## 2019-08-20 ENCOUNTER — Encounter (HOSPITAL_COMMUNITY)
Admission: RE | Disposition: A | Discharge status: Home / Self Care | Payer: Self-pay | Source: Home / Self Care | Attending: Neurosurgery

## 2019-08-20 ENCOUNTER — Encounter (HOSPITAL_COMMUNITY): Payer: Self-pay | Admitting: Neurosurgery

## 2019-08-20 ENCOUNTER — Inpatient Hospital Stay (HOSPITAL_COMMUNITY): Payer: No Typology Code available for payment source | Admitting: Certified Registered Nurse Anesthetist

## 2019-08-20 ENCOUNTER — Inpatient Hospital Stay (HOSPITAL_COMMUNITY): Payer: No Typology Code available for payment source | Admitting: Vascular Surgery

## 2019-08-20 ENCOUNTER — Inpatient Hospital Stay (HOSPITAL_COMMUNITY)
Admission: RE | Admit: 2019-08-20 | Discharge: 2019-08-21 | DRG: 455 | Disposition: A | Discharge status: Home / Self Care | Payer: No Typology Code available for payment source | Attending: Neurosurgery | Admitting: Neurosurgery

## 2019-08-20 ENCOUNTER — Inpatient Hospital Stay (HOSPITAL_COMMUNITY): Payer: No Typology Code available for payment source

## 2019-08-20 DIAGNOSIS — Z885 Allergy status to narcotic agent status: Secondary | ICD-10-CM | POA: Diagnosis not present

## 2019-08-20 DIAGNOSIS — M5116 Intervertebral disc disorders with radiculopathy, lumbar region: Secondary | ICD-10-CM | POA: Diagnosis present

## 2019-08-20 DIAGNOSIS — Z87891 Personal history of nicotine dependence: Secondary | ICD-10-CM

## 2019-08-20 DIAGNOSIS — Z419 Encounter for procedure for purposes other than remedying health state, unspecified: Secondary | ICD-10-CM

## 2019-08-20 DIAGNOSIS — M48062 Spinal stenosis, lumbar region with neurogenic claudication: Principal | ICD-10-CM | POA: Diagnosis present

## 2019-08-20 DIAGNOSIS — Z20822 Contact with and (suspected) exposure to covid-19: Secondary | ICD-10-CM | POA: Diagnosis present

## 2019-08-20 DIAGNOSIS — M5126 Other intervertebral disc displacement, lumbar region: Secondary | ICD-10-CM | POA: Diagnosis present

## 2019-08-20 SURGERY — POSTERIOR LUMBAR FUSION 1 LEVEL
Anesthesia: General | Site: Spine Lumbar

## 2019-08-20 MED ORDER — CHLORHEXIDINE GLUCONATE 0.12 % MT SOLN
OROMUCOSAL | Status: AC
  Administered 2019-08-20: 15 mL via OROMUCOSAL
  Filled 2019-08-20: qty 15

## 2019-08-20 MED ORDER — BUPIVACAINE-EPINEPHRINE (PF) 0.5% -1:200000 IJ SOLN
INTRAMUSCULAR | Status: DC | PRN
  Administered 2019-08-20: 10 mL

## 2019-08-20 MED ORDER — CHLORHEXIDINE GLUCONATE CLOTH 2 % EX PADS: 6.0000 | MEDICATED_PAD | Freq: Once | CUTANEOUS | Status: DC

## 2019-08-20 MED ORDER — THROMBIN 5000 UNITS EX SOLR
OROMUCOSAL | Status: DC | PRN
  Administered 2019-08-20: 5 mL

## 2019-08-20 MED ORDER — FENTANYL CITRATE (PF) 100 MCG/2ML IJ SOLN
INTRAMUSCULAR | Status: AC
  Administered 2019-08-20: 50 ug via INTRAVENOUS
  Filled 2019-08-20: qty 2

## 2019-08-20 MED ORDER — EPHEDRINE 5 MG/ML INJ
INTRAVENOUS | Status: AC
  Filled 2019-08-20: qty 20

## 2019-08-20 MED ORDER — CHLORHEXIDINE GLUCONATE 0.12 % MT SOLN: 15.0000 mL | Freq: Once | OROMUCOSAL | Status: AC

## 2019-08-20 MED ORDER — ACETAMINOPHEN 500 MG PO TABS: 1000.0000 mg | ORAL_TABLET | Freq: Once | ORAL | Status: AC

## 2019-08-20 MED ORDER — ACETAMINOPHEN 500 MG PO TABS
ORAL_TABLET | ORAL | Status: AC
  Administered 2019-08-20: 1000 mg via ORAL
  Filled 2019-08-20: qty 2

## 2019-08-20 MED ORDER — ROCURONIUM BROMIDE 10 MG/ML (PF) SYRINGE
PREFILLED_SYRINGE | INTRAVENOUS | Status: DC | PRN
  Administered 2019-08-20: 15 mg via INTRAVENOUS
  Administered 2019-08-20: 50 mg via INTRAVENOUS
  Administered 2019-08-20 (×2): 10 mg via INTRAVENOUS

## 2019-08-20 MED ORDER — FENTANYL CITRATE (PF) 250 MCG/5ML IJ SOLN
INTRAMUSCULAR | Status: AC
  Filled 2019-08-20: qty 5

## 2019-08-20 MED ORDER — PHENYLEPHRINE HCL (PRESSORS) 10 MG/ML IV SOLN
INTRAVENOUS | Status: DC | PRN
  Administered 2019-08-20: 40 ug via INTRAVENOUS
  Administered 2019-08-20 (×3): 80 ug via INTRAVENOUS

## 2019-08-20 MED ORDER — PHENYLEPHRINE 40 MCG/ML (10ML) SYRINGE FOR IV PUSH (FOR BLOOD PRESSURE SUPPORT)
PREFILLED_SYRINGE | INTRAVENOUS | Status: AC
  Filled 2019-08-20: qty 10

## 2019-08-20 MED ORDER — SUGAMMADEX SODIUM 200 MG/2ML IV SOLN
INTRAVENOUS | Status: DC | PRN
  Administered 2019-08-20: 200 mg via INTRAVENOUS

## 2019-08-20 MED ORDER — BUPIVACAINE-EPINEPHRINE 0.5% -1:200000 IJ SOLN
INTRAMUSCULAR | Status: AC
  Filled 2019-08-20: qty 1

## 2019-08-20 MED ORDER — LIDOCAINE 2% (20 MG/ML) 5 ML SYRINGE
INTRAMUSCULAR | Status: AC
  Filled 2019-08-20: qty 10

## 2019-08-20 MED ORDER — ORAL CARE MOUTH RINSE: 15.0000 mL | Freq: Once | OROMUCOSAL | Status: AC

## 2019-08-20 MED ORDER — SCOPOLAMINE 1 MG/3DAYS TD PT72
MEDICATED_PATCH | TRANSDERMAL | Status: AC
  Filled 2019-08-20: qty 1

## 2019-08-20 MED ORDER — MIDAZOLAM HCL 2 MG/2ML IJ SOLN
INTRAMUSCULAR | Status: AC
  Filled 2019-08-20: qty 2

## 2019-08-20 MED ORDER — LIDOCAINE 2% (20 MG/ML) 5 ML SYRINGE
INTRAMUSCULAR | Status: DC | PRN
  Administered 2019-08-20: 60 mg via INTRAVENOUS

## 2019-08-20 MED ORDER — BACITRACIN ZINC 500 UNIT/GM EX OINT
TOPICAL_OINTMENT | CUTANEOUS | Status: AC
  Filled 2019-08-20: qty 28.35

## 2019-08-20 MED ORDER — CYCLOBENZAPRINE HCL 10 MG PO TABS
10.0000 mg | ORAL_TABLET | Freq: Three times a day (TID) | ORAL | Status: DC | PRN
  Administered 2019-08-21: 10 mg via ORAL
  Filled 2019-08-20: qty 1

## 2019-08-20 MED ORDER — LACTATED RINGERS IV SOLN: INTRAVENOUS | Status: DC

## 2019-08-20 MED ORDER — MIDAZOLAM HCL 5 MG/5ML IJ SOLN
INTRAMUSCULAR | Status: DC | PRN
  Administered 2019-08-20: 2 mg via INTRAVENOUS

## 2019-08-20 MED ORDER — PHENOL 1.4 % MT LIQD: 1.0000 | OROMUCOSAL | Status: DC | PRN

## 2019-08-20 MED ORDER — ONDANSETRON HCL 4 MG/2ML IJ SOLN
INTRAMUSCULAR | Status: DC | PRN
  Administered 2019-08-20: 4 mg via INTRAVENOUS

## 2019-08-20 MED ORDER — DEXAMETHASONE SODIUM PHOSPHATE 10 MG/ML IJ SOLN
INTRAMUSCULAR | Status: AC
  Filled 2019-08-20: qty 1

## 2019-08-20 MED ORDER — CEFAZOLIN SODIUM-DEXTROSE 2-4 GM/100ML-% IV SOLN
INTRAVENOUS | Status: AC
  Filled 2019-08-20: qty 100

## 2019-08-20 MED ORDER — ACETAMINOPHEN 325 MG PO TABS: 650.0000 mg | ORAL_TABLET | ORAL | Status: DC | PRN

## 2019-08-20 MED ORDER — ONDANSETRON HCL 4 MG PO TABS: 4.0000 mg | ORAL_TABLET | Freq: Four times a day (QID) | ORAL | Status: DC | PRN

## 2019-08-20 MED ORDER — THROMBIN 5000 UNITS EX SOLR
CUTANEOUS | Status: AC
  Filled 2019-08-20: qty 5000

## 2019-08-20 MED ORDER — DEXAMETHASONE SODIUM PHOSPHATE 10 MG/ML IJ SOLN
INTRAMUSCULAR | Status: DC | PRN
  Administered 2019-08-20: 10 mg via INTRAVENOUS

## 2019-08-20 MED ORDER — FENTANYL CITRATE (PF) 100 MCG/2ML IJ SOLN
INTRAMUSCULAR | Status: DC | PRN
  Administered 2019-08-20: 50 ug via INTRAVENOUS
  Administered 2019-08-20: 100 ug via INTRAVENOUS

## 2019-08-20 MED ORDER — EPHEDRINE SULFATE-NACL 50-0.9 MG/10ML-% IV SOSY
PREFILLED_SYRINGE | INTRAVENOUS | Status: DC | PRN
  Administered 2019-08-20 (×3): 5 mg via INTRAVENOUS

## 2019-08-20 MED ORDER — MORPHINE SULFATE (PF) 4 MG/ML IV SOLN
4.0000 mg | INTRAVENOUS | Status: DC | PRN
  Administered 2019-08-20: 4 mg via INTRAVENOUS
  Filled 2019-08-20: qty 1

## 2019-08-20 MED ORDER — CEFAZOLIN SODIUM-DEXTROSE 2-4 GM/100ML-% IV SOLN
2.0000 g | Freq: Three times a day (TID) | INTRAVENOUS | Status: AC
  Administered 2019-08-20 – 2019-08-21 (×2): 2 g via INTRAVENOUS
  Filled 2019-08-20 (×2): qty 100

## 2019-08-20 MED ORDER — SODIUM CHLORIDE 0.9% FLUSH: 3.0000 mL | INTRAVENOUS | Status: DC | PRN

## 2019-08-20 MED ORDER — 0.9 % SODIUM CHLORIDE (POUR BTL) OPTIME
TOPICAL | Status: DC | PRN
  Administered 2019-08-20: 1000 mL

## 2019-08-20 MED ORDER — ACETAMINOPHEN 650 MG RE SUPP: 650.0000 mg | RECTAL | Status: DC | PRN

## 2019-08-20 MED ORDER — ONDANSETRON HCL 4 MG/2ML IJ SOLN: 4.0000 mg | Freq: Four times a day (QID) | INTRAMUSCULAR | Status: DC | PRN

## 2019-08-20 MED ORDER — PROPOFOL 10 MG/ML IV BOLUS
INTRAVENOUS | Status: AC
  Filled 2019-08-20: qty 20

## 2019-08-20 MED ORDER — MENTHOL 3 MG MT LOZG: 1.0000 | LOZENGE | OROMUCOSAL | Status: DC | PRN

## 2019-08-20 MED ORDER — PROPOFOL 10 MG/ML IV BOLUS
INTRAVENOUS | Status: DC | PRN
  Administered 2019-08-20: 150 mg via INTRAVENOUS

## 2019-08-20 MED ORDER — AMISULPRIDE (ANTIEMETIC) 5 MG/2ML IV SOLN: 10.0000 mg | Freq: Once | INTRAVENOUS | Status: AC | PRN

## 2019-08-20 MED ORDER — HYDROMORPHONE HCL 2 MG PO TABS
2.0000 mg | ORAL_TABLET | ORAL | Status: DC | PRN
  Administered 2019-08-20 – 2019-08-21 (×5): 4 mg via ORAL
  Filled 2019-08-20 (×5): qty 2

## 2019-08-20 MED ORDER — ACETAMINOPHEN 500 MG PO TABS
1000.0000 mg | ORAL_TABLET | Freq: Four times a day (QID) | ORAL | Status: AC
  Administered 2019-08-20 – 2019-08-21 (×3): 1000 mg via ORAL
  Filled 2019-08-20 (×3): qty 2

## 2019-08-20 MED ORDER — CEFAZOLIN SODIUM-DEXTROSE 2-4 GM/100ML-% IV SOLN
2.0000 g | INTRAVENOUS | Status: AC
  Administered 2019-08-20: 2 g via INTRAVENOUS

## 2019-08-20 MED ORDER — BUPIVACAINE LIPOSOME 1.3 % IJ SUSP
INTRAMUSCULAR | Status: DC | PRN
  Administered 2019-08-20: 20 mL

## 2019-08-20 MED ORDER — SODIUM CHLORIDE 0.9 % IV SOLN: 250.0000 mL | INTRAVENOUS | Status: DC

## 2019-08-20 MED ORDER — SODIUM CHLORIDE 0.9% FLUSH
3.0000 mL | Freq: Two times a day (BID) | INTRAVENOUS | Status: DC
  Administered 2019-08-20: 3 mL via INTRAVENOUS

## 2019-08-20 MED ORDER — ONDANSETRON HCL 4 MG/2ML IJ SOLN
INTRAMUSCULAR | Status: AC
  Filled 2019-08-20: qty 2

## 2019-08-20 MED ORDER — BACITRACIN ZINC 500 UNIT/GM EX OINT
TOPICAL_OINTMENT | CUTANEOUS | Status: DC | PRN
  Administered 2019-08-20: 1 via TOPICAL

## 2019-08-20 MED ORDER — ROCURONIUM BROMIDE 10 MG/ML (PF) SYRINGE
PREFILLED_SYRINGE | INTRAVENOUS | Status: AC
  Filled 2019-08-20: qty 20

## 2019-08-20 MED ORDER — SODIUM CHLORIDE 0.9 % IV SOLN
INTRAVENOUS | Status: DC | PRN
  Administered 2019-08-20: 500 mL

## 2019-08-20 MED ORDER — DOCUSATE SODIUM 100 MG PO CAPS
100.0000 mg | ORAL_CAPSULE | Freq: Two times a day (BID) | ORAL | Status: DC
  Administered 2019-08-20 – 2019-08-21 (×2): 100 mg via ORAL
  Filled 2019-08-20 (×2): qty 1

## 2019-08-20 MED ORDER — AMISULPRIDE (ANTIEMETIC) 5 MG/2ML IV SOLN
INTRAVENOUS | Status: AC
  Administered 2019-08-20: 10 mg via INTRAVENOUS
  Filled 2019-08-20: qty 4

## 2019-08-20 MED ORDER — PHENYLEPHRINE HCL-NACL 10-0.9 MG/250ML-% IV SOLN
INTRAVENOUS | Status: DC | PRN
Start: 2019-08-20 — End: 2019-08-20
  Administered 2019-08-20: 25 ug/min via INTRAVENOUS

## 2019-08-20 MED ORDER — BISACODYL 10 MG RE SUPP: 10.0000 mg | Freq: Every day | RECTAL | Status: DC | PRN

## 2019-08-20 MED ORDER — FENTANYL CITRATE (PF) 100 MCG/2ML IJ SOLN
25.0000 ug | INTRAMUSCULAR | Status: DC | PRN
  Administered 2019-08-20: 50 ug via INTRAVENOUS

## 2019-08-20 SURGICAL SUPPLY — 62 items
BAG DECANTER FOR FLEXI CONT (MISCELLANEOUS) ×3 IMPLANT
BENZOIN TINCTURE PRP APPL 2/3 (GAUZE/BANDAGES/DRESSINGS) ×3 IMPLANT
BLADE CLIPPER SURG (BLADE) IMPLANT
BUR MATCHSTICK NEURO 3.0 LAGG (BURR) ×3 IMPLANT
BUR PRECISION FLUTE 6.0 (BURR) ×3 IMPLANT
CANISTER SUCT 3000ML PPV (MISCELLANEOUS) ×3 IMPLANT
CARTRIDGE OIL MAESTRO DRILL (MISCELLANEOUS) ×1 IMPLANT
CLOSURE WOUND 1/2 X4 (GAUZE/BANDAGES/DRESSINGS) ×1
CNTNR URN SCR LID CUP LEK RST (MISCELLANEOUS) ×1 IMPLANT
CONT SPEC 4OZ STRL OR WHT (MISCELLANEOUS) ×2
COVER BACK TABLE 60X90IN (DRAPES) ×3 IMPLANT
COVER WAND RF STERILE (DRAPES) ×3 IMPLANT
DECANTER SPIKE VIAL GLASS SM (MISCELLANEOUS) ×3 IMPLANT
DIFFUSER DRILL AIR PNEUMATIC (MISCELLANEOUS) ×3 IMPLANT
DRAPE C-ARM 42X72 X-RAY (DRAPES) ×6 IMPLANT
DRAPE HALF SHEET 40X57 (DRAPES) ×3 IMPLANT
DRAPE LAPAROTOMY 100X72X124 (DRAPES) ×3 IMPLANT
DRAPE SURG 17X23 STRL (DRAPES) ×12 IMPLANT
DRSG OPSITE POSTOP 4X6 (GAUZE/BANDAGES/DRESSINGS) ×3 IMPLANT
ELECT BLADE 4.0 EZ CLEAN MEGAD (MISCELLANEOUS) ×3
ELECT REM PT RETURN 9FT ADLT (ELECTROSURGICAL) ×3
ELECTRODE BLDE 4.0 EZ CLN MEGD (MISCELLANEOUS) ×1 IMPLANT
ELECTRODE REM PT RTRN 9FT ADLT (ELECTROSURGICAL) ×1 IMPLANT
EVACUATOR 1/8 PVC DRAIN (DRAIN) IMPLANT
GAUZE 4X4 16PLY RFD (DISPOSABLE) ×3 IMPLANT
GAUZE SPONGE 4X4 12PLY STRL (GAUZE/BANDAGES/DRESSINGS) ×3 IMPLANT
GLOVE BIO SURGEON STRL SZ8 (GLOVE) ×6 IMPLANT
GLOVE BIO SURGEON STRL SZ8.5 (GLOVE) ×6 IMPLANT
GLOVE EXAM NITRILE XL STR (GLOVE) IMPLANT
GOWN STRL REUS W/ TWL LRG LVL3 (GOWN DISPOSABLE) IMPLANT
GOWN STRL REUS W/ TWL XL LVL3 (GOWN DISPOSABLE) ×2 IMPLANT
GOWN STRL REUS W/TWL 2XL LVL3 (GOWN DISPOSABLE) IMPLANT
GOWN STRL REUS W/TWL LRG LVL3 (GOWN DISPOSABLE)
GOWN STRL REUS W/TWL XL LVL3 (GOWN DISPOSABLE) ×4
HEMOSTAT POWDER KIT SURGIFOAM (HEMOSTASIS) ×3 IMPLANT
KIT BASIN OR (CUSTOM PROCEDURE TRAY) ×3 IMPLANT
KIT TURNOVER KIT B (KITS) ×3 IMPLANT
MILL MEDIUM DISP (BLADE) ×3 IMPLANT
NEEDLE HYPO 21X1.5 SAFETY (NEEDLE) IMPLANT
NEEDLE HYPO 22GX1.5 SAFETY (NEEDLE) ×3 IMPLANT
NS IRRIG 1000ML POUR BTL (IV SOLUTION) ×3 IMPLANT
OIL CARTRIDGE MAESTRO DRILL (MISCELLANEOUS) ×3
PACK LAMINECTOMY NEURO (CUSTOM PROCEDURE TRAY) ×3 IMPLANT
PAD ARMBOARD 7.5X6 YLW CONV (MISCELLANEOUS) ×9 IMPLANT
PATTIES SURGICAL .5 X1 (DISPOSABLE) IMPLANT
PUTTY DBM 10CC CALC GRAN (Putty) ×3 IMPLANT
ROD RELINE 5.5X90MM LORDOTIC (Rod) ×6 IMPLANT
SCREW LOCK RELINE 5.5 TULIP (Screw) ×21 IMPLANT
SCREW RELINE-O POLY 6.5X50MM (Screw) ×6 IMPLANT
SPACER ALTERA 10X31-8 (Spacer) ×3 IMPLANT
SPONGE LAP 4X18 RFD (DISPOSABLE) IMPLANT
SPONGE NEURO XRAY DETECT 1X3 (DISPOSABLE) IMPLANT
SPONGE SURGIFOAM ABS GEL 100 (HEMOSTASIS) IMPLANT
STRIP CLOSURE SKIN 1/2X4 (GAUZE/BANDAGES/DRESSINGS) ×2 IMPLANT
SUT VIC AB 1 CT1 18XBRD ANBCTR (SUTURE) ×2 IMPLANT
SUT VIC AB 1 CT1 8-18 (SUTURE) ×4
SUT VIC AB 2-0 CP2 18 (SUTURE) ×6 IMPLANT
SYR 20ML LL LF (SYRINGE) IMPLANT
TOWEL GREEN STERILE (TOWEL DISPOSABLE) ×3 IMPLANT
TOWEL GREEN STERILE FF (TOWEL DISPOSABLE) ×3 IMPLANT
TRAY FOLEY MTR SLVR 16FR STAT (SET/KITS/TRAYS/PACK) ×3 IMPLANT
WATER STERILE IRR 1000ML POUR (IV SOLUTION) ×3 IMPLANT

## 2019-08-20 NOTE — Anesthesia Procedure Notes (Signed)
Procedure Name: Intubation Date/Time: 08/20/2019 11:55 AM Performed by: Epifanio Lesches, CRNA Pre-anesthesia Checklist: Patient identified, Emergency Drugs available, Suction available and Patient being monitored Patient Re-evaluated:Patient Re-evaluated prior to induction Oxygen Delivery Method: Circle System Utilized Preoxygenation: Pre-oxygenation with 100% oxygen Induction Type: IV induction Ventilation: Mask ventilation without difficulty Laryngoscope Size: Miller and 3 Grade View: Grade I Tube type: Oral Tube size: 7.5 mm Number of attempts: 1 Airway Equipment and Method: Stylet and Oral airway Placement Confirmation: ETT inserted through vocal cords under direct vision,  positive ETCO2 and breath sounds checked- equal and bilateral Secured at: 21 cm Tube secured with: Tape Dental Injury: Teeth and Oropharynx as per pre-operative assessment  Comments: Inserted by Tomasita Morrow, SRNA under direct supervision.

## 2019-08-20 NOTE — Anesthesia Preprocedure Evaluation (Signed)
Anesthesia Evaluation  Patient identified by MRN, date of birth, ID band Patient awake    Reviewed: Allergy & Precautions, NPO status , Patient's Chart, lab work & pertinent test results  History of Anesthesia Complications (+) PONV  Airway Mallampati: II   Neck ROM: Full    Dental  (+) Dental Advisory Given   Pulmonary former smoker,    breath sounds clear to auscultation       Cardiovascular negative cardio ROS   Rhythm:Regular Rate:Normal     Neuro/Psych negative neurological ROS     GI/Hepatic negative GI ROS, Neg liver ROS,   Endo/Other  negative endocrine ROS  Renal/GU negative Renal ROS     Musculoskeletal   Abdominal   Peds  Hematology negative hematology ROS (+)   Anesthesia Other Findings   Reproductive/Obstetrics                             Anesthesia Physical Anesthesia Plan  ASA: I  Anesthesia Plan: General   Post-op Pain Management:    Induction: Intravenous  PONV Risk Score and Plan: 3 and Midazolam, Dexamethasone, Ondansetron and Treatment may vary due to age or medical condition  Airway Management Planned: Oral ETT  Additional Equipment: None  Intra-op Plan:   Post-operative Plan: Extubation in OR  Informed Consent: I have reviewed the patients History and Physical, chart, labs and discussed the procedure including the risks, benefits and alternatives for the proposed anesthesia with the patient or authorized representative who has indicated his/her understanding and acceptance.     Dental advisory given  Plan Discussed with: CRNA  Anesthesia Plan Comments:         Anesthesia Quick Evaluation

## 2019-08-20 NOTE — Progress Notes (Signed)
   Providing Compassionate, Quality Care - Together   Subjective: Patient reports no numbness or tingling of his lower extremities. Pain is well-controlled. Nurse reports patient has been battling significant nausea while in the PACU.  Objective: Vital signs in last 24 hours: Temp:  [97.6 F (36.4 C)-99.6 F (37.6 C)] 97.6 F (36.4 C) (08/16 1630) Pulse Rate:  [76-92] 79 (08/16 1630) Resp:  [11-18] 16 (08/16 1630) BP: (135-197)/(75-93) 137/76 (08/16 1630) SpO2:  [96 %-100 %] 98 % (08/16 1630)  Intake/Output from previous day: No intake/output data recorded. Intake/Output this shift: Total I/O In: 1300 [I.V.:1200; IV Piggyback:100] Out: 1025 [Urine:950; Blood:75]  Alert and oriented x 4 PERRLA CN II-XII grossly intact MAE, Strength and sensation intact Incision is covered with Honeycomb dressing and Steri Strips; Dressing is clean, dry, and intact   Lab Results: No results for input(s): WBC, HGB, HCT, PLT in the last 72 hours. BMET No results for input(s): NA, K, CL, CO2, GLUCOSE, BUN, CREATININE, CALCIUM in the last 72 hours.  Studies/Results: DG Lumbar Spine 2-3 Views  Result Date: 08/20/2019 CLINICAL DATA:  L3-4 fusion. EXAM: LUMBAR SPINE - 2-3 VIEW; DG C-ARM 1-60 MIN COMPARISON:  Lumbar spine MRI 04/10/2019 FINDINGS: New pedicle screws at L3-4 and new interbody fusion device at L3-4. No complicating features are identified. Incidental hook like metallic foreign body noted at L3-4 posteriorly which may be part of the delivery device. Recommend clinical correlation. IMPRESSION: 1. L3-4 fusion hardware in good position without complicating features. 2. Metallic foreign body at L3-4 posteriorly, possibly part of the delivery device. Electronically Signed   By: Rudie Meyer M.D.   On: 08/20/2019 15:05   DG C-Arm 1-60 Min  Result Date: 08/20/2019 CLINICAL DATA:  L3-4 fusion. EXAM: LUMBAR SPINE - 2-3 VIEW; DG C-ARM 1-60 MIN COMPARISON:  Lumbar spine MRI 04/10/2019 FINDINGS:  New pedicle screws at L3-4 and new interbody fusion device at L3-4. No complicating features are identified. Incidental hook like metallic foreign body noted at L3-4 posteriorly which may be part of the delivery device. Recommend clinical correlation. IMPRESSION: 1. L3-4 fusion hardware in good position without complicating features. 2. Metallic foreign body at L3-4 posteriorly, possibly part of the delivery device. Electronically Signed   By: Rudie Meyer M.D.   On: 08/20/2019 15:05    Assessment/Plan: Patient underwent an L3-4 interbody fusion with extension of his posterior segmental instrumentation from L3-L5. He is recovering in the PACU.    LOS: 0 days    -Patient to be admitted to [facility] -Mobilize as tolerated   Val Eagle, DNP, AGNP-C Nurse Practitioner  [facility] [address], [address], [place], [place] [address] P: [phone]    F: [phone]  08/20/2019, 4:41 PM

## 2019-08-20 NOTE — Transfer of Care (Signed)
Immediate Anesthesia Transfer of Care Note  Patient: Phillip Kane  Procedure(s) Performed: POSTERIOR LUMBAR INTERBODY FUSION, IP,POSTERIOR INSTRUMENTATION LUMBAR THREE- LUMBAR FOUR; EXPLORE FUSION (N/A Spine Lumbar)  Patient Location: PACU  Anesthesia Type:General  Level of Consciousness: awake and alert   Airway & Oxygen Therapy: Patient Spontanous Breathing and Patient connected to nasal cannula oxygen  Post-op Assessment: Report given to RN, Post -op Vital signs reviewed and stable and Patient moving all extremities X 4  Post vital signs: Reviewed and stable  Last Vitals:  Vitals Value Taken Time  BP 145/93 08/20/19 1516  Temp    Pulse 98 08/20/19 1519  Resp 11 08/20/19 1519  SpO2 100 % 08/20/19 1519  Vitals shown include unvalidated device data.  Last Pain:  Vitals:   08/20/19 1058  TempSrc: Oral  PainSc: 0-No pain      Patients Stated Pain Goal: 5 (08/20/19 1058)  Complications: No complications documented.

## 2019-08-20 NOTE — Anesthesia Postprocedure Evaluation (Signed)
Anesthesia Post Note  Patient: Phillip Kane  Procedure(s) Performed: POSTERIOR LUMBAR INTERBODY FUSION, IP,POSTERIOR INSTRUMENTATION LUMBAR THREE- LUMBAR FOUR; EXPLORE FUSION (N/A Spine Lumbar)     Patient location during evaluation: PACU Anesthesia Type: General Level of consciousness: awake and alert Pain management: pain level controlled Vital Signs Assessment: post-procedure vital signs reviewed and stable Respiratory status: spontaneous breathing, nonlabored ventilation, respiratory function stable and patient connected to nasal cannula oxygen Cardiovascular status: blood pressure returned to baseline and stable Postop Assessment: no apparent nausea or vomiting Anesthetic complications: no   No complications documented.  Last Vitals:  Vitals:   08/20/19 1535 08/20/19 1545  BP:  135/79  Pulse: 88 82  Resp: 14 12  Temp:    SpO2: 100% 98%    Last Pain:  Vitals:   08/20/19 1610  TempSrc:   PainSc: 6                  Kennieth Rad

## 2019-08-20 NOTE — H&P (Signed)
Subjective: The patient is a 49 year old male who has had previous back surgeries.  He had a lumbar fusion done at L4-5 in [place] a few years ago.  He has developed recurrent back and leg pain consistent with neurogenic claudication.  He has failed medical management and was worked up with a lumbar MRI which demonstrated a large hernia disc and severe stenosis at L3-4.  I discussed the various treatment options with the patient.  He has decided proceed with surgery.  Past Medical History:  Diagnosis Date  . Complication of anesthesia    Trouble waking up from anesthesia  . Depression    h/o  . PONV (postoperative nausea and vomiting)     Past Surgical History:  Procedure Laterality Date  . BACK SURGERY  2018,2010   Fusion lumber in [place], [place] [facility]    Allergies  Allergen Reactions  . Hydrocodone Nausea And Vomiting    Social History   Tobacco Use  . Smoking status: Former Smoker    Packs/day: 0.20    Years: 25.00    Pack years: 5.00    Types: Cigarettes    Quit date: 02/15/2019    Years since quitting: 0.5  . Smokeless tobacco: Never Used  Substance Use Topics  . Alcohol use: Yes    Alcohol/week: 0.0 standard drinks    Comment: Social drinker 1-2 beers rarely    History reviewed. No pertinent family history. Prior to Admission medications   Medication Sig Start Date End Date Taking? Authorizing Provider  diclofenac (VOLTAREN) 75 MG EC tablet Take 75 mg by mouth 4 (four) times daily as needed for moderate pain.   Yes [provider]  oxyCODONE (OXY IR/ROXICODONE) 5 MG immediate release tablet Take 2.5 mg by mouth every 6 (six) hours as needed for moderate pain.  08/04/16  Yes [provider]  cyclobenzaprine (FLEXERIL) 10 MG tablet Take 1 tablet (10 mg total) by mouth 3 (three) times daily as needed for muscle spasms. Patient not taking: Reported on 05/28/2019 09/27/16   Pricilla Loveless, MD  finasteride (PROSCAR) 5 MG tablet Take 0.5 tablets (2.5 mg  total) by mouth daily. Patient not taking: Reported on 05/28/2019 01/20/17   Barbaraann Barthel, NP  ibuprofen (ADVIL,MOTRIN) 600 MG tablet Take 1 tablet (600 mg total) by mouth every 8 (eight) hours as needed. Patient not taking: Reported on 05/28/2019 09/27/16   Pricilla Loveless, MD     Review of Systems  Positive ROS: As above  All other systems have been reviewed and were otherwise negative with the exception of those mentioned in the HPI and as above.  Objective: Vital signs in last 24 hours:   Estimated body mass index is 22.99 kg/m as calculated from the following:   Height as of 08/17/19: 6\' 3"  (1.905 m).   Weight as of 08/17/19: 83.4 kg.   General Appearance: Alert Head: Normocephalic, without obvious abnormality, atraumatic Eyes: PERRL, conjunctiva/corneas clear, EOM's intact,    Ears: Normal  Throat: Normal  Neck: Supple, Back: His lumbar incision is well-healed. Lungs: Clear to auscultation bilaterally, respirations unlabored Heart: Regular rate and rhythm, no murmur, rub or gallop Abdomen: Soft, non-tender Extremities: Extremities normal, atraumatic, no cyanosis or edema Skin: unremarkable  NEUROLOGIC:   Mental status: alert and oriented,Motor Exam - grossly normal Sensory Exam - grossly normal Reflexes:  Coordination - grossly normal Gait - grossly normal Balance - grossly normal Cranial Nerves: I: smell Not tested  II: visual acuity  OS: Normal  OD: Normal  II: visual fields Full to confrontation  II: pupils Equal, round, reactive to light  III,VII: ptosis None  III,IV,VI: extraocular muscles  Full ROM  V: mastication Normal  V: facial light touch sensation  Normal  V,VII: corneal reflex  Present  VII: facial muscle function - upper  Normal  VII: facial muscle function - lower Normal  VIII: hearing Not tested  IX: soft palate elevation  Normal  IX,X: gag reflex Present  XI: trapezius strength  5/5  XI: sternocleidomastoid strength 5/5  XI: neck  flexion strength  5/5  XII: tongue strength  Normal    Data Review Lab Results  Component Value Date   WBC 6.3 08/17/2019   HGB 13.9 08/17/2019   HCT 42.8 08/17/2019   MCV 93.2 08/17/2019   PLT 188 08/17/2019   Lab Results  Component Value Date   NA 143 10/19/2016   K 4.9 10/19/2016   CL 102 10/19/2016   BUN 7 10/19/2016   CREATININE 0.83 10/19/2016   GLUCOSE 93 10/19/2016   No results found for: INR, PROTIME  Assessment/Plan: L3-4 kyphosis, degenerative disc disease, stenosis, lumbago, lumbar radiculopathy, neurogenic claudication: I have discussed the situation with the patient and his daughter.  I reviewed his MRI scan with him and pointed out the abnormalities.  We have discussed the various treatment options including surgery.  I have described the surgical treatment option of an exploration of his lumbar fusion and an L3-4 decompression, instrumentation and fusion.  I have shown him surgical models.  I have given him a surgical pamphlet.  We have discussed the risks, benefits, alternatives, expected postoperative course, and likelihood of achieving our goals with surgery.  I have answered all his questions.  He has decided to proceed with surgery.   Cristi Loron 08/20/2019 10:57 AM

## 2019-08-20 NOTE — Progress Notes (Signed)
Orthopedic Tech Progress Note Patient Details:  [name] 11/27/1970 [id number] Fit patient for LSO brace and left bedside for patient. Ortho Devices Type of Ortho Device: Lumbar corsett Ortho Device/Splint Location: Back Ortho Device/Splint Interventions: Application, Ordered, Adjustment   Post Interventions Patient Tolerated: Well Instructions Provided: Adjustment of device, Care of device  Patient ID: Phillip Kane, male   DOB: 08/30/1970, 49 y.o.   MRN: [id number]   Lovett Calender 08/20/2019, 6:57 PM

## 2019-08-20 NOTE — Progress Notes (Signed)
Subjective: The patient is somnolent but easily arousable.  He is in no apparent distress.  He looks well.  Objective: Vital signs in last 24 hours: Temp:  [98.1 F (36.7 C)-99.6 F (37.6 C)] 99.6 F (37.6 C) (08/16 1520) Pulse Rate:  [88-92] 88 (08/16 1535) Resp:  [12-18] 14 (08/16 1535) BP: (140-197)/(76-93) 140/76 (08/16 1530) SpO2:  [99 %-100 %] 100 % (08/16 1535) Estimated body mass index is 22.99 kg/m as calculated from the following:   Height as of 08/17/19: 6\' 3"  (1.905 m).   Weight as of 08/17/19: 83.4 kg.   Intake/Output from previous day: No intake/output data recorded. Intake/Output this shift: Total I/O In: 1300 [I.V.:1200; IV Piggyback:100] Out: 875 [Urine:800; Blood:75]  Physical exam the patient is somnolent but arousable.  He is moving his lower extremities well.  Lab Results: No results for input(s): WBC, HGB, HCT, PLT in the last 72 hours. BMET No results for input(s): NA, K, CL, CO2, GLUCOSE, BUN, CREATININE, CALCIUM in the last 72 hours.  Studies/Results: DG Lumbar Spine 2-3 Views  Result Date: 08/20/2019 CLINICAL DATA:  L3-4 fusion. EXAM: LUMBAR SPINE - 2-3 VIEW; DG C-ARM 1-60 MIN COMPARISON:  Lumbar spine MRI 04/10/2019 FINDINGS: New pedicle screws at L3-4 and new interbody fusion device at L3-4. No complicating features are identified. Incidental hook like metallic foreign body noted at L3-4 posteriorly which may be part of the delivery device. Recommend clinical correlation. IMPRESSION: 1. L3-4 fusion hardware in good position without complicating features. 2. Metallic foreign body at L3-4 posteriorly, possibly part of the delivery device. Electronically Signed   By: 06/10/2019 M.D.   On: 08/20/2019 15:05   DG C-Arm 1-60 Min  Result Date: 08/20/2019 CLINICAL DATA:  L3-4 fusion. EXAM: LUMBAR SPINE - 2-3 VIEW; DG C-ARM 1-60 MIN COMPARISON:  Lumbar spine MRI 04/10/2019 FINDINGS: New pedicle screws at L3-4 and new interbody fusion device at L3-4. No  complicating features are identified. Incidental hook like metallic foreign body noted at L3-4 posteriorly which may be part of the delivery device. Recommend clinical correlation. IMPRESSION: 1. L3-4 fusion hardware in good position without complicating features. 2. Metallic foreign body at L3-4 posteriorly, possibly part of the delivery device. Electronically Signed   By: 06/10/2019 M.D.   On: 08/20/2019 15:05    Assessment/Plan: The patient is doing well.  I spoke with his wife.  LOS: 0 days     08/22/2019 08/20/2019, 4:07 PM

## 2019-08-20 NOTE — Progress Notes (Signed)
Heather in PACU notified that patient speaks [place] and will need an interpreter/ipad in PACU.   Also requested that his wife be informed of patient's status in PACU.  Heather verbalized understanding.

## 2019-08-20 NOTE — Op Note (Signed)
Brief history: The patient is a 49 year old male who has had a prior lumbar fusion by another physician.  He has developed recurrent back and leg pain consistent with neurogenic claudication.  He failed medical management and was worked up with a lumbar MRI which demonstrated large herniated disc at L3-4 with severe spinal stenosis.  I discussed the various treatment options with him he has decided proceed with surgery after weighing the risks, benefits and alternatives.  Preoperative diagnosis: L3-4 herniated disc, degenerative disc disease, spinal stenosis compressing both the L3 and the L4 nerve roots; lumbago; lumbar radiculopathy; neurogenic claudication  Postoperative diagnosis: The same  Procedure: Right L3-4 laminotomy/foraminotomies/medial facetectomy/discectomy to decompress the bilateral L4 nerve roots(the work required to do this was in addition to the work required to do the posterior lumbar interbody fusion because of the patient's spinal stenosis, facet arthropathy. Etc. requiring a wide decompression of the nerve roots.);  Right L3-4 transforaminal lumbar interbody fusion with local morselized autograft bone and Zimmer DBM; insertion of interbody prosthesis at L3-4 (globus peek expandable interbody prosthesis); posterior segmental instrumentation from L3 to L5 with globus titanium pedicle screws and rods; posterior lateral arthrodesis at L3-4 with local morselized autograft bone and Zimmer DBM; exploration of lumbar fusion/removal of lumbar hardware.  Surgeon: Dr. Delma Officer  Asst.: Hildred Priest, NP  Anesthesia: Gen. endotracheal  Estimated blood loss: 200 cc  Drains: None  Complications: None  Description of procedure: The patient was brought to the operating room by the anesthesia team. General endotracheal anesthesia was induced. The patient was turned to the prone position on the Wilson frame. The patient's lumbosacral region was then prepared with Betadine scrub and  Betadine solution. Sterile drapes were applied.  I then injected the area to be incised with Marcaine with epinephrine solution. I then used the scalpel to make a linear midline incision over the L3-4 and L4-5 interspace, incising through the old surgical scar. I then used electrocautery to perform a bilateral subperiosteal dissection exposing the spinous process and lamina of L3, L4 and L5 and exposing the old hardware at L4-5.  We then inserted the Verstrac retractor to provide exposure.  We explored the fusion by removing the caps from the old screws then removed the rods.  We inspected the arthrodesis at L4-5 it appeared solid.   I began the decompression by using the high speed drill to perform a right L3-4 laminotomy. We then used the Kerrison punches to widen the laminotomy and removed the ligamentum flavum at L3-4 on the right, we also removed the right L3-4 facet.  We carefully dissected in the epidural space and encountered as expected a large herniated disc at L3-4.  We removed it using the pituitary forceps decompressing the thecal sac.  We now turned our attention to the posterior lumbar interbody fusion. I used a scalpel to incise the intervertebral disc at L3-4 on the right. I then performed a partial intervertebral discectomy at L3-4 from the right using the pituitary forceps. We prepared the vertebral endplates at C4 from the right for the fusion by removing the soft tissues with the curettes. We then used the trial spacers to pick the appropriate sized interbody prosthesis. We prefilled his prosthesis with a combination of local morselized autograft bone that we obtained during the decompression as well as Zimmer DBM. We inserted the prefilled prosthesis into the interspace at L3-4 from the right, we then turned and expanded the prosthesis. There was a good snug fit of the prosthesis in the  interspace. We then filled and the remainder of the intervertebral disc space with local morselized  autograft bone and Zimmer DBM. This completed the posterior lumbar interbody arthrodesis.  During the decompression and insertion of the prosthesis the assistant protected the thecal sac and nerve roots with the D'Errico retractor.  We now turned attention to the instrumentation. Under fluoroscopic guidance we cannulated the bilateral L3 pedicles with the bone probe. We then removed the bone probe. We then tapped the pedicle with a 6.5 millimeter tap. We then removed the tap. We probed inside the tapped pedicle with a ball probe to rule out cortical breaches. We then inserted a 7.5 x 50 millimeter pedicle screw into the L3 pedicles bilaterally under fluoroscopic guidance. We then palpated along the medial aspect of the pedicles to rule out cortical breaches. There were none. The nerve roots were not injured. We then connected the unilateral pedicle screws from L3-L5 bilaterally with a mildly lordotic rod. We compressed the construct and secured the rod in place with the caps. We then tightened the caps appropriately. This completed the instrumentation from L3-L5 bilaterally.  We now turned our attention to the posterior lateral arthrodesis at L3-4 bilaterally. We used the high-speed drill to decorticate the remainder of the facets, pars, transverse process at L3-4 bilaterally. We then applied a combination of local morselized autograft bone and Zimmer DBM over these decorticated posterior lateral structures. This completed the posterior lateral arthrodesis.  We then obtained hemostasis using bipolar electrocautery. We irrigated the wound out with bacitracin solution. We inspected the thecal sac and nerve roots and noted they were well decompressed. We then removed the retractor.  We injected Exparel . We reapproximated patient's thoracolumbar fascia with interrupted #1 Vicryl suture. We reapproximated patient's subcutaneous tissue with interrupted 2-0 Vicryl suture. The reapproximated patient's skin with  Steri-Strips and benzoin. The wound was then coated with bacitracin ointment. A sterile dressing was applied. The drapes were removed. The patient was subsequently returned to the supine position where they were extubated by the anesthesia team. He was then transported to the post anesthesia care unit in stable condition. All sponge instrument and needle counts were reportedly correct at the end of this case.

## 2019-08-21 LAB — CBC
HCT: 36.2 % — ABNORMAL LOW (ref 39.0–52.0)
Hemoglobin: 12.1 g/dL — ABNORMAL LOW (ref 13.0–17.0)
MCH: 30.8 pg (ref 26.0–34.0)
MCHC: 33.4 g/dL (ref 30.0–36.0)
MCV: 92.1 fL (ref 80.0–100.0)
Platelets: 197 10*3/uL (ref 150–400)
RBC: 3.93 MIL/uL — ABNORMAL LOW (ref 4.22–5.81)
RDW: 12.6 % (ref 11.5–15.5)
WBC: 16.8 10*3/uL — ABNORMAL HIGH (ref 4.0–10.5)
nRBC: 0 % (ref 0.0–0.2)

## 2019-08-21 LAB — BASIC METABOLIC PANEL
Anion gap: 10 (ref 5–15)
BUN: 14 mg/dL (ref 6–20)
CO2: 23 mmol/L (ref 22–32)
Calcium: 8.9 mg/dL (ref 8.9–10.3)
Chloride: 104 mmol/L (ref 98–111)
Creatinine, Ser: 0.92 mg/dL (ref 0.61–1.24)
GFR calc Af Amer: >60 mL/min (ref >60)
GFR calc non Af Amer: >60 mL/min (ref >60)
Glucose, Bld: 123 mg/dL — ABNORMAL HIGH (ref 70–99)
Potassium: 4.2 mmol/L (ref 3.5–5.1)
Sodium: 137 mmol/L (ref 135–145)

## 2019-08-21 MED ORDER — HYDROMORPHONE HCL 2 MG PO TABS: 2.0000 mg | ORAL_TABLET | Freq: Four times a day (QID) | ORAL | 0 refills | Status: AC | PRN

## 2019-08-21 MED ORDER — OXYCODONE-ACETAMINOPHEN 5-325 MG PO TABS: 1.0000 | ORAL_TABLET | ORAL | 0 refills | Status: DC | PRN

## 2019-08-21 MED ORDER — OXYCODONE-ACETAMINOPHEN 5-325 MG PO TABS
1.0000 | ORAL_TABLET | ORAL | Status: DC | PRN
  Filled 2019-08-21: qty 2

## 2019-08-21 MED ORDER — DOCUSATE SODIUM 100 MG PO CAPS: 100.0000 mg | ORAL_CAPSULE | Freq: Two times a day (BID) | ORAL | 0 refills | Status: AC

## 2019-08-21 NOTE — Progress Notes (Signed)
Patient is discharged from room 3C07 at this time. Alert and in stable condition. IV site d/c'd and instructions read to patient and spouse with understanding verbalized and all questions answered. Left unit via wheelchair with all belongings at side. 

## 2019-08-21 NOTE — Evaluation (Signed)
Occupational Therapy Evaluation Patient Details Name: Phillip Kane MRN: [id number] DOB: May 21, 1970 Today's Date: 08/21/2019    History of Present Illness 49 yo amel s/p L3-4 interbody fusion with extension of posterior segmental instrumentation from L3-5 PMH depression PONV back surgery 2018 2010   Clinical Impression   Patient evaluated by Occupational Therapy with no further acute OT needs identified. All education has been completed and the patient has no further questions. See below for any follow-up Occupational Therapy or equipment needs. OT to sign off. Thank you for referral.      Follow Up Recommendations  No OT follow up    Equipment Recommendations  Other (comment) (RW)    Recommendations for Other Services       Precautions / Restrictions Precautions Precautions: Back Precaution Comments: handout provided Required Braces or Orthoses: Spinal Brace Spinal Brace: Lumbar corset;Applied in supine position      Mobility Bed Mobility Overal bed mobility: Modified Independent             General bed mobility comments: cues for sequence  Transfers Overall transfer level: Modified independent                    Balance                                           ADL either performed or assessed with clinical judgement   ADL Overall ADL's : Needs assistance/impaired Eating/Feeding: Independent   Grooming: Wash/dry face;Supervision/safety;Standing   Upper Body Bathing: Supervision/ safety;Sitting   Lower Body Bathing: Moderate assistance;Sit to/from stand       Lower Body Dressing: Moderate assistance;Sit to/from stand Lower Body Dressing Details (indicate cue type and reason): educated on use of reacher. pt needs (A) to thread L LE but able to cross R LE. pt educated to dress LLE first.  Toilet Transfer: Min guard     Toileting - Clothing Manipulation Details (indicate cue type and reason): educated and demonstrates toilet  tongs. pt plans to buy a pair      Functional mobility during ADLs: Min guard General ADL Comments: pt reports previous falls and concerns. Recommend PT bring RW for session to trial with house hold ambulation simulation     Vision Baseline Vision/History: No visual deficits       Perception     Praxis      Pertinent Vitals/Pain Pain Assessment: Faces Faces Pain Scale: Hurts little more Pain Location: back Pain Descriptors / Indicators: Operative site guarding Pain Intervention(s): Monitored during session;Repositioned     Hand Dominance Right   Extremity/Trunk Assessment Upper Extremity Assessment Upper Extremity Assessment: Overall WFL for tasks assessed   Lower Extremity Assessment Lower Extremity Assessment: Defer to PT evaluation   Cervical / Trunk Assessment Cervical / Trunk Assessment: Other exceptions (s/p surg)   Communication Communication Communication: Interpreter utilized   Cognition Arousal/Alertness: Awake/alert Behavior During Therapy: WFL for tasks assessed/performed Overall Cognitive Status: Within Functional Limits for tasks assessed                                     General Comments  dressing dry and intact    Exercises     Shoulder Instructions      Home Living Family/patient expects to be discharged to:: Private residence Living Arrangements: Spouse/significant  other;Children Available Help at Discharge: Family;Available 24 hours/day (for first 2 weeks 24/7) Type of Home: House Home Access: Stairs to enter [employer] of Steps: 1   Home Layout: One level     Bathroom Shower/Tub: Chief Strategy Officer: Standard     Home Equipment: None   Additional Comments: wife has a trip in 2 weeks/ dog named Molly ( yorkie)   Back handout provided and reviewed adls in detail. Pt educated on: clothing between brace, never sleep in brace, set an alarm at night for medication, avoid sitting for long  periods of time, correct bed positioning for sleeping, correct sequence for bed mobility, avoiding lifting more than 5 pounds and never wash directly over incision. All education is complete and patient indicates understanding.      Prior Functioning/Environment Level of Independence: Independent with assistive device(s)                 OT Problem List:        OT Treatment/Interventions:      OT Goals(Current goals can be found in the care plan section) Acute Rehab OT Goals Patient Stated Goal: none stated  OT Frequency:     Barriers to D/C:            Co-evaluation              AM-PAC OT "6 Clicks" Daily Activity     Outcome Measure Help from another person eating meals?: None Help from another person taking care of personal grooming?: None Help from another person toileting, which includes using toliet, bedpan, or urinal?: A Little Help from another person bathing (including washing, rinsing, drying)?: A Little Help from another person to put on and taking off regular upper body clothing?: None Help from another person to put on and taking off regular lower body clothing?: A Little 6 Click Score: 21   End of Session Nurse Communication: Mobility status;Precautions  Activity Tolerance:   Patient left: in bed;with call bell/phone within reach;with family/visitor present  OT Visit Diagnosis: Unsteadiness on feet (R26.81)                Time: 9449-6759 OT Time Calculation (min): 59 min Charges:  OT General Charges $OT Visit: 1 Visit OT Evaluation $OT Eval Moderate Complexity: 1 Mod OT Treatments $Self Care/Home Management : 38-52 mins   Brynn, OTR/L  Acute Rehabilitation Services Pager: [phone] Office: [phone] .   Mateo Flow 08/21/2019, 12:40 PM

## 2019-08-21 NOTE — Discharge Instructions (Signed)

## 2019-08-21 NOTE — TOC Progression Note (Signed)
Transition of Care [facility]) - Progression Note    Patient Details  Name: Phillip Kane MRN: [id number] Date of Birth: 02-25-1970  [facility] CM/SW Contact  Beckie Busing, RN Phone Number: [phone] 08/21/2019, 3:45 PM  Clinical Narrative:    CM consulted to set up home health PT for patient. CM at bedside with patient that speaks very little [place] he states that he speaks [place]. Patient states he can not read English and has had home health before but does not remember  the agency. List of agencies reviewed verbally. Patient is agreeable to have CM call [facility] to inititate services. CM called [facility], [facility], [facility], [facility], [facility] and [facility]. [employer] insurance is not in network with any of the agencies. CM can not find an agency that is willing to provide [facility] PT for patient.  Bedside nurse has been updated. CM will sign off.      Barriers to Discharge: No Home Care Agency will accept this patient  Expected Discharge Plan and Services           Expected Discharge Date: 08/21/19               DME Arranged: N/A DME Agency: NA       HH Arranged: NA (all HH agencies refused patient not innetwork with insurance) HH Agency: NA         Social Determinants of Health (SDOH) Interventions    Readmission Risk Interventions No flowsheet data found.

## 2019-08-21 NOTE — Evaluation (Signed)
Physical Therapy Evaluation Patient Details Name: Phillip Kane MRN: [id number] DOB: [birth year] Today's Date: 08/21/2019   History of Present Illness  49 yo male s/p L3-4 interbody fusion with extension of posterior segmental instrumentation from L3-5 PMH depression PONV back surgery 2018 2010  Clinical Impression  Pt admitted with above diagnosis. Pt presents with surgical back pain and BLE weakness. Mod I for bed mobility though increased time needed and vc's for precautions. Pt ambulated 150' with RW and min A, last 25' difficult due to LE weakness. Reviewed activity level for return home, car transfer, posture, abdominal activation, and seating choices at home. Interpreter used for better comprehension.  Pt currently with functional limitations due to the deficits listed below (see PT Problem List). Pt will benefit from skilled PT to increase their independence and safety with mobility to allow discharge to the venue listed below.       Follow Up Recommendations Home health PT    Equipment Recommendations  Rolling walker with 5" wheels;Other (comment) (needs to be a tall RW, pt 6'3")    Recommendations for Other Services       Precautions / Restrictions Precautions Precautions: Back Precaution Booklet Issued: Yes (comment) Precaution Comments: reviewed precautions Required Braces or Orthoses: Spinal Brace Spinal Brace: Lumbar corset;Applied in supine position Restrictions Weight Bearing Restrictions: No      Mobility  Bed Mobility Overal bed mobility: Modified Independent             General bed mobility comments: cues for log rolling, increased time needed. Practiced supine to sit and sit to supine. Pt had more pain with return to supine  Transfers Overall transfer level: Modified independent Equipment used: Rolling walker (2 wheeled)             General transfer comment: vc's for hand placement  Ambulation/Gait Ambulation/Gait assistance: Min assist Gait  Distance (Feet): 150 Feet Assistive device: Rolling walker (2 wheeled) Gait Pattern/deviations: Step-through pattern;Antalgic Gait velocity: decreased Gait velocity interpretation: <1.31 ft/sec, indicative of household ambulator General Gait Details: vc's for posture, pt haf difficulty last 25' due to LE weakness  Stairs            Wheelchair Mobility    Modified Rankin (Stroke Patients Only)       Balance Overall balance assessment: Needs assistance;History of Falls Sitting-balance support: Bilateral upper extremity supported;Feet supported Sitting balance-Leahy Scale: Fair Sitting balance - Comments: pt can maintain balance without UE support but braces self with UE's to control pain   Standing balance support: No upper extremity supported Standing balance-Leahy Scale: Fair Standing balance comment: can maintain static stance without UE support                             Pertinent Vitals/Pain Pain Assessment: 0-10 Pain Score: 8  Faces Pain Scale: Hurts little more Pain Location: back Pain Descriptors / Indicators: Operative site guarding;Grimacing;Guarding Pain Intervention(s): Limited activity within patient's tolerance;Monitored during session    Home Living Family/patient expects to be discharged to:: Private residence Living Arrangements: Spouse/significant other;Children Available Help at Discharge: Family;Available 24 hours/day (first 2 wks) Type of Home: House Home Access: Stairs to enter   [employer] of Steps: 1 Home Layout: One level Home Equipment: None Additional Comments: wife has a trip in 2 weeks/ dog named 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 44 yo daughter lives with them but works    Prior Function Level of Independence: Independent  Hand Dominance   Dominant Hand: Right    Extremity/Trunk Assessment   Upper Extremity Assessment Upper Extremity Assessment: Defer to OT evaluation    Lower Extremity  Assessment Lower Extremity Assessment: Generalized weakness;RLE deficits/detail;LLE deficits/detail RLE Deficits / Details: hip flex 3-/5, knee ext 3-/5 RLE: Unable to fully assess due to pain RLE Sensation: decreased proprioception RLE Coordination: decreased gross motor LLE Deficits / Details: hip flex 3-/5, knee ext 3-/5 LLE: Unable to fully assess due to pain LLE Sensation: decreased proprioception LLE Coordination: decreased gross motor    Cervical / Trunk Assessment Cervical / Trunk Assessment: Other exceptions Cervical / Trunk Exceptions: back surgery  Communication   Communication: Interpreter utilized  Cognition Arousal/Alertness: Awake/alert Behavior During Therapy: WFL for tasks assessed/performed Overall Cognitive Status: Within Functional Limits for tasks assessed                                        General Comments General comments (skin integrity, edema, etc.): reviewed activity level for return home as well as car transfer. Pt sat in chair for 10 mins suring education, was then ready for return to bed.     Exercises Other Exercises Other Exercises: abdominal activation in supine x10   Assessment/Plan    PT Assessment Patient needs continued PT services  PT Problem List Decreased strength;Decreased activity tolerance;Decreased balance;Decreased mobility;Decreased knowledge of use of DME;Decreased knowledge of precautions;Pain       PT Treatment Interventions DME instruction;Gait training;Stair training;Functional mobility training;Therapeutic activities;Therapeutic exercise;Balance training;Neuromuscular re-education;Patient/family education    PT Goals (Current goals can be found in the Care Plan section)  Acute Rehab PT Goals Patient Stated Goal: return home PT Goal Formulation: With patient Time For Goal Achievement: 08/28/19 Potential to Achieve Goals: Good    Frequency Min 5X/week   Barriers to discharge        Co-evaluation                AM-PAC PT "6 Clicks" Mobility  Outcome Measure Help needed turning from your back to your side while in a flat bed without using bedrails?: A Little Help needed moving from lying on your back to sitting on the side of a flat bed without using bedrails?: A Little Help needed moving to and from a bed to a chair (including a wheelchair)?: A Little Help needed standing up from a chair using your arms (e.g., wheelchair or bedside chair)?: A Little Help needed to walk in hospital room?: A Little Help needed climbing 3-5 steps with a railing? : A Little 6 Click Score: 18    End of Session Equipment Utilized During Treatment: Gait belt;Back brace Activity Tolerance: Patient tolerated treatment well Patient left: in bed;with call bell/phone within reach;with family/visitor present Nurse Communication: Mobility status PT Visit Diagnosis: Pain;Difficulty in walking, not elsewhere classified (R26.2);Muscle weakness (generalized) (M62.81) Pain - part of body:  (back)    Time: 6945-0388 PT Time Calculation (min) (ACUTE ONLY): 48 min   Charges:   PT Evaluation $PT Eval Moderate Complexity: 1 Mod PT Treatments $Gait Training: 8-22 mins $Therapeutic Activity: 8-22 mins        Lyanne Co, PT  Acute Rehab Services  Pager [phone] Office [phone]   Lawana Chambers Carly Applegate 08/21/2019, 1:34 PM

## 2019-08-21 NOTE — Discharge Summary (Signed)
Physician Discharge Summary Patient ID: Phillip Kane MRN: [id number] DOB/AGE: 05-07-1970 49 y.o.  Admit date: 08/20/2019 Discharge date: 08/21/2019  Admission Diagnoses: lumbar herniated disc, lumbar spinal stenosis, lumbago, lumbar radiculopathy  Discharge Diagnoses:  The same Active Problems:   Lumbar herniated disc   Discharged Condition: good  Hospital Course:  I performed an exploration of the patient's lumbar fusion and an L3-4 decompression, instrumentation and fusion on 08/20/2019.  The surgery went well.    The patient's postoperative course was unremarkable.  On postoperative day 1. He requested discharge home.  His back was appropriately sore but his leg pain was gone.  He was given written and oral discharge instructions.  All his questions were answered.  Consults:  PT, OT, care management Significant Diagnostic Studies: none Treatments: exploration of lumbar fusion, L3-4 decompression, instrumentation and fusion. Discharge Exam: Blood pressure (!) 100/58, pulse 62, temperature 97.7 F (36.5 C), temperature source Oral, resp. rate 16, height 6\' 3"  (1.905 m), weight 83.4 kg, SpO2 96 %.   The patient is alert and pleasant.  He looks well.  He is in no apparent distress.  His lower extremity strength is normal.  Disposition:   Home  Discharge Instructions   Call MD for:  difficulty breathing, headache or visual disturbances   Complete by: As directed   Call MD for:  extreme fatigue   Complete by: As directed   Call MD for:  hives   Complete by: As directed   Call MD for:  persistant dizziness or light-headedness   Complete by: As directed   Call MD for:  persistant nausea and vomiting   Complete by: As directed   Call MD for:  redness, tenderness, or signs of infection (pain, swelling, redness, odor or green/yellow discharge around incision site)   Complete by: As directed   Call MD for:  severe uncontrolled pain   Complete by: As directed   Call MD for:   temperature >100.4   Complete by: As directed   Diet - low sodium heart healthy   Complete by: As directed   Discharge instructions   Complete by: As directed   Call [phone] for a followup appointment. Take a stool softener while you are using pain medications.  Driving Restrictions   Complete by: As directed   Do not drive for 2 weeks.  Increase activity slowly   Complete by: As directed   Lifting restrictions   Complete by: As directed   Do not lift more than 5 pounds. No excessive bending or twisting.  May shower / Bathe   Complete by: As directed   Remove the dressing for 3 days after surgery.  You may shower, but leave the incision alone.  Remove dressing in 48 hours   Complete by: As directed    Allergies as of 08/21/2019     Reactions  Hydrocodone Nausea And Vomiting   Medication List  STOP taking these medications  cyclobenzaprine 10 MG tablet Commonly known as: FLEXERIL  diclofenac 75 MG EC tablet Commonly known as: VOLTAREN  finasteride 5 MG tablet Commonly known as: PROSCAR  ibuprofen 600 MG tablet Commonly known as: ADVIL  oxyCODONE 5 MG immediate release tablet Commonly known as: Oxy IR/ROXICODONE   TAKE these medications  docusate sodium 100 MG capsule Commonly known as: COLACE Take 1 capsule (100 mg total) by mouth 2 (two) times daily.  oxyCODONE-acetaminophen 5-325 MG tablet Commonly known as: PERCOCET/ROXICET Take 1-2 tablets by mouth every 4 (four) hours as needed for  moderate pain.      Signed: Cristi Loron 08/21/2019, 8:10 AM

## 2019-08-24 MED FILL — Heparin Sodium (Porcine) Inj 1000 Unit/ML: INTRAMUSCULAR | Qty: 30 | Status: AC

## 2019-08-24 MED FILL — Sodium Chloride IV Soln 0.9%: INTRAVENOUS | Qty: 1000 | Status: AC

## 2020-04-08 ENCOUNTER — Other Ambulatory Visit: Payer: Self-pay | Admitting: Neurosurgery

## 2020-04-08 DIAGNOSIS — M5416 Radiculopathy, lumbar region: Secondary | ICD-10-CM

## 2020-04-19 ENCOUNTER — Ambulatory Visit
Admission: RE | Admit: 2020-04-19 | Discharge: 2020-04-19 | Disposition: A | Discharge status: Home / Self Care | Payer: Medicare Other | Source: Ambulatory Visit | Attending: Neurosurgery | Admitting: Neurosurgery

## 2020-04-19 DIAGNOSIS — M5416 Radiculopathy, lumbar region: Secondary | ICD-10-CM

## 2020-04-19 MED ORDER — GADOBENATE DIMEGLUMINE 529 MG/ML IV SOLN
17.0000 mL | Freq: Once | INTRAVENOUS | Status: AC | PRN
  Administered 2020-04-19: 17 mL via INTRAVENOUS

## 2021-06-09 ENCOUNTER — Ambulatory Visit
Admission: RE | Admit: 2021-06-09 | Discharge: 2021-06-09 | Disposition: A | Discharge status: Home / Self Care | Payer: Medicare Other | Source: Ambulatory Visit

## 2021-06-09 VITALS — BP 141/74 | HR 68 | Temp 98.2°F | Resp 18

## 2021-06-09 DIAGNOSIS — K047 Periapical abscess without sinus: Secondary | ICD-10-CM

## 2021-06-09 DIAGNOSIS — K0889 Other specified disorders of teeth and supporting structures: Secondary | ICD-10-CM

## 2021-06-09 MED ORDER — IBUPROFEN 800 MG PO TABS: 800.0000 mg | ORAL_TABLET | Freq: Three times a day (TID) | ORAL | 0 refills | Status: AC | PRN

## 2021-06-09 MED ORDER — AMOXICILLIN 875 MG PO TABS: 875.0000 mg | ORAL_TABLET | Freq: Two times a day (BID) | ORAL | 0 refills | Status: DC

## 2021-06-09 NOTE — ED Provider Notes (Signed)
Phillip Kane    CSN: [id number] Arrival date & time: 06/09/21  1731      History   Chief Complaint Chief Complaint  Patient presents with   Dental Pain    HPI Phillip Kane is a 51 y.o. male.   HPI Patient presents today for evaluation right upper molar tooth pain.  He reports pain has been controlled over the last 24 hours however 3 hours ago the pain became intense and he has some mild right facial swelling. He denies any prior issues with this tooth.  He took ibuprofen a few hours ago which is dull the pain although pain remains present. Past Medical History:  Diagnosis Date   Complication of anesthesia    Trouble waking up from anesthesia   Depression    h/o   PONV (postoperative nausea and vomiting)     Patient Active Problem List   Diagnosis Date Noted   Lumbar herniated disc 08/20/2019    Past Surgical History:  Procedure Laterality Date   BACK SURGERY  2018,2010   Fusion lumber in [place], [place] [facility]       Home Medications    Prior to Admission medications   Medication Sig Start Date End Date Taking? Authorizing Provider  amoxicillin (AMOXIL) 875 MG tablet Take 1 tablet (875 mg total) by mouth 2 (two) times daily. 06/09/21  Yes Bing Neighbors, FNP  ibuprofen (ADVIL) 800 MG tablet Take 1 tablet (800 mg total) by mouth every 8 (eight) hours as needed. 06/09/21  Yes Bing Neighbors, FNP  cyclobenzaprine (FLEXERIL) 10 MG tablet Take 10 mg by mouth 3 (three) times daily as needed. 01/30/21   [provider]  docusate sodium (COLACE) 100 MG capsule Take 1 capsule (100 mg total) by mouth 2 (two) times daily. 08/21/19   Tressie Stalker, MD  gabapentin (NEURONTIN) 300 MG capsule Take 300 mg by mouth 3 (three) times daily. 01/30/21   [provider]    Family History History reviewed. No pertinent family history.  Social History Social History   Tobacco Use   Smoking status: Former    Packs/day: 0.20    Years: 25.00    Pack  years: 5.00    Types: Cigarettes    Quit date: 02/15/2019    Years since quitting: 2.3   Smokeless tobacco: Never  Vaping Use   Vaping Use: Never used  Substance Use Topics   Alcohol use: Yes    Alcohol/week: 0.0 standard drinks    Comment: Social drinker 1-2 beers rarely   Drug use: No     Allergies   Acetaminophen, Hydrocodone, and Oxycodone   Review of Systems Review of Systems Pertinent negatives listed in HPI   Physical Exam Triage Vital Signs ED Triage Vitals  Enc Vitals Group     BP 06/09/21 1803 (!) 141/74     Pulse Rate 06/09/21 1803 68     Resp 06/09/21 1803 18     Temp 06/09/21 1803 98.2 F (36.8 C)     Temp Source 06/09/21 1803 Oral     SpO2 06/09/21 1803 96 %     Weight --      Height --      Head Circumference --      Peak Flow --      Pain Score 06/09/21 1806 9     Pain Loc --      Pain Edu? --      Excl. in GC? --    No data  found.  Updated Vital Signs BP (!) 141/74 (BP Location: Left Arm)   Pulse 68   Temp 98.2 F (36.8 C) (Oral)   Resp 18   SpO2 96%   Visual Acuity Right Eye Distance:   Left Eye Distance:   Bilateral Distance:    Right Eye Near:   Left Eye Near:    Bilateral Near:     Physical Exam Constitutional:      Appearance: Normal appearance.  HENT:     Head: Normocephalic.     Mouth/Throat:     Dentition: Abnormal dentition. Dental tenderness and gingival swelling present.  Eyes:     Extraocular Movements: Extraocular movements intact.     Pupils: Pupils are equal, round, and reactive to light.  Cardiovascular:     Rate and Rhythm: Normal rate and regular rhythm.  Pulmonary:     Effort: Pulmonary effort is normal.     Breath sounds: Normal breath sounds.  Neurological:     Mental Status: He is alert.  Psychiatric:        Attention and Perception: Attention and perception normal.        Mood and Affect: Mood normal.        Speech: Speech normal.        Behavior: Behavior normal.     UC Treatments / Results   Labs (all labs ordered are listed, but only abnormal results are displayed) Labs Reviewed - No data to display  EKG   Radiology No results found.  Procedures Procedures (including critical care time)  Medications Ordered in UC Medications - No data to display  Initial Impression / Assessment and Plan / UC Course  I have reviewed the triage vital signs and the nursing notes.  Pertinent labs & imaging results that were available during my care of the patient were reviewed by me and considered in my medical decision making (see chart for details).    Dental pain and infection Ibuprofen  800 mg TID PRN Amoxicillin 875 mg BID x 10 days Follow-up with dental provider as needed.  Final Clinical Impressions(s) / UC Diagnoses   Final diagnoses:  Pain, dental  Dental infection     Discharge Instructions      Follow-up with a dental provider if your tooth continue to bother you. You may take another dose of Ibuprofen at 10 pm Take your antibiotics as soon as you eat something     ED Prescriptions     Medication Sig Dispense Auth. Provider   ibuprofen (ADVIL) 800 MG tablet Take 1 tablet (800 mg total) by mouth every 8 (eight) hours as needed. 21 tablet Bing Neighbors, FNP   amoxicillin (AMOXIL) 875 MG tablet Take 1 tablet (875 mg total) by mouth 2 (two) times daily. 20 tablet Bing Neighbors, FNP      PDMP not reviewed this encounter.   Bing Neighbors, FNP 06/09/21 [phone]

## 2021-06-09 NOTE — Discharge Instructions (Addendum)
Follow-up with a dental provider if your tooth continue to bother you. You may take another dose of Ibuprofen at 10 pm Take your antibiotics as soon as you eat something

## 2021-06-09 NOTE — ED Triage Notes (Signed)
Pt presents with upper right side dental pain that started 3 hours ago

## 2022-12-10 ENCOUNTER — Telehealth: Payer: Self-pay

## 2022-12-10 ENCOUNTER — Ambulatory Visit
Admission: EM | Admit: 2022-12-10 | Discharge: 2022-12-10 | Disposition: A | Discharge status: Home / Self Care | Payer: Medicare Other | Attending: Physician Assistant | Admitting: Physician Assistant

## 2022-12-10 DIAGNOSIS — K047 Periapical abscess without sinus: Secondary | ICD-10-CM

## 2022-12-10 MED ORDER — AMOXICILLIN 500 MG PO CAPS: 500.0000 mg | ORAL_CAPSULE | Freq: Three times a day (TID) | ORAL | 0 refills | Status: AC

## 2022-12-10 MED ORDER — AMOXICILLIN 500 MG PO CAPS: 500.0000 mg | ORAL_CAPSULE | Freq: Three times a day (TID) | ORAL | 0 refills | Status: DC

## 2022-12-10 NOTE — Telephone Encounter (Signed)
Patient called clinic states he wants [place] to resend prescription to [facility] since [facility] has closed. Prescription was resent, patient notified.

## 2022-12-10 NOTE — ED Provider Notes (Signed)
[name]    CSN: [id number] Arrival date & time: 12/10/22  1809      History   Chief Complaint Chief Complaint  Patient presents with   Dental Pain    HPI Phillip Kane is a 52 y.o. male.   For evaluation of right-sided lower dental pain that started last night.  He does have a crown in place to this tooth but states he started having worsening pain last night.  He has tried ibuprofen without resolution.  He does have some mild swelling to the area.  He did try to contact his dentist but they are closed today.  He denies any fever, nausea or vomiting.  The history is provided by the patient.  Dental Pain Associated symptoms: no fever   Patient here today  Past Medical History:  Diagnosis Date   Complication of anesthesia    Trouble waking up from anesthesia   Depression    h/o   PONV (postoperative nausea and vomiting)     Patient Active Problem List   Diagnosis Date Noted   Lumbar herniated disc 08/20/2019    Past Surgical History:  Procedure Laterality Date   BACK SURGERY  2018,2010   Fusion lumber in [place], [place] [facility]       Home Medications    Prior to Admission medications   Medication Sig Start Date End Date Taking? Authorizing Provider  amoxicillin (AMOXIL) 500 MG capsule Take 1 capsule (500 mg total) by mouth 3 (three) times daily. 12/10/22  Yes Tomi Bamberger, PA-C  cyclobenzaprine (FLEXERIL) 10 MG tablet Take 10 mg by mouth 3 (three) times daily as needed. 01/30/21   [provider]  docusate sodium (COLACE) 100 MG capsule Take 1 capsule (100 mg total) by mouth 2 (two) times daily. 08/21/19   Tressie Stalker, MD  gabapentin (NEURONTIN) 300 MG capsule Take 300 mg by mouth 3 (three) times daily. 01/30/21   [provider]  ibuprofen (ADVIL) 800 MG tablet Take 1 tablet (800 mg total) by mouth every 8 (eight) hours as needed. 06/09/21   Bing Neighbors, NP    Family History History reviewed. No pertinent family  history.  Social History Social History   Tobacco Use   Smoking status: Former    Current packs/day: 0.00    Average packs/day: 0.2 packs/day for 25.0 years (5.0 ttl pk-yrs)    Types: Cigarettes    Start date: 02/14/1994    Quit date: 02/15/2019    Years since quitting: 3.8   Smokeless tobacco: Never  Vaping Use   Vaping status: Never Used  Substance Use Topics   Alcohol use: Yes    Alcohol/week: 0.0 standard drinks of alcohol    Comment: Social drinker 1-2 beers rarely   Drug use: No     Allergies   Acetaminophen, Hydrocodone, and Oxycodone   Review of Systems Review of Systems  Constitutional:  Negative for chills and fever.  HENT:  Positive for dental problem.   Eyes:  Negative for discharge and redness.  Respiratory:  Negative for shortness of breath.   Gastrointestinal:  Negative for nausea and vomiting.  Neurological:  Negative for numbness.     Physical Exam Triage Vital Signs ED Triage Vitals [12/10/22 1846]  Encounter Vitals Group     BP (!) 155/83     Systolic BP Percentile      Diastolic BP Percentile      Pulse Rate 75     Resp 16  Temp 97.9 F (36.6 C)     Temp Source Oral     SpO2 97 %     Weight      Height      Head Circumference      Peak Flow      Pain Score 4     Pain Loc      Pain Education      Exclude from Growth Chart    No data found.  Updated Vital Signs BP (!) 155/83 (BP Location: Left Arm)   Pulse 75   Temp 97.9 F (36.6 C) (Oral)   Resp 16   SpO2 97%      Physical Exam Vitals and nursing note reviewed.  Constitutional:      General: He is not in acute distress.    Appearance: Normal appearance. He is not ill-appearing.  HENT:     Head: Normocephalic and atraumatic.     Nose: Nose normal. No congestion or rhinorrhea.     Mouth/Throat:     Comments: Gingival swelling appreciated to proximal right lower molars Eyes:     Conjunctiva/sclera: Conjunctivae normal.  Cardiovascular:     Rate and Rhythm: Normal  rate.  Pulmonary:     Effort: Pulmonary effort is normal. No respiratory distress.  Neurological:     Mental Status: He is alert.  Psychiatric:        Mood and Affect: Mood normal.        Behavior: Behavior normal.        Thought Content: Thought content normal.      UC Treatments / Results  Labs (all labs ordered are listed, but only abnormal results are displayed) Labs Reviewed - No data to display  EKG   Radiology No results found.  Procedures Procedures (including critical care time)  Medications Ordered in UC Medications - No data to display  Initial Impression / Assessment and Plan / UC Course  I have reviewed the triage vital signs and the nursing notes.  Pertinent labs & imaging results that were available during my care of the patient were reviewed by me and considered in my medical decision making (see chart for details).    Will treat to cover possible dental abscess with amoxicillin and advised follow-up with dentistry the beginning of next week.  Patient expressed understanding.  Final Clinical Impressions(s) / UC Diagnoses   Final diagnoses:  Dental abscess   Discharge Instructions   None    ED Prescriptions     Medication Sig Dispense Auth. Provider   amoxicillin (AMOXIL) 500 MG capsule Take 1 capsule (500 mg total) by mouth 3 (three) times daily. 21 capsule Tomi Bamberger, PA-C      PDMP not reviewed this encounter.   Tomi Bamberger, PA-C 12/10/22 1927

## 2022-12-10 NOTE — ED Triage Notes (Signed)
Patient presents to UC for right sided lower dental pain since last night. Crowns placed last year, states dentist closed today. Treating pain with ibuprofen. Concerned with infection.

## 2023-03-07 ENCOUNTER — Other Ambulatory Visit: Payer: Self-pay | Admitting: Neurosurgery

## 2023-03-07 ENCOUNTER — Encounter: Payer: Self-pay | Admitting: Neurosurgery

## 2023-03-07 DIAGNOSIS — G8929 Other chronic pain: Secondary | ICD-10-CM

## 2023-03-18 IMAGING — MR MR LUMBAR SPINE WO/W CM
5 of 8 series · 24 of 48 positions shown · IV contrast (multihance)
Comparison: Previous MRI from 04/10/2019.

CLINICAL DATA: Initial evaluation for left lower extremity pain for
2 months. History of prior surgery.

EXAM:
MRI LUMBAR SPINE WITHOUT AND WITH CONTRAST
TECHNIQUE: Multiplanar and multiecho pulse sequences of the lumbar spine were
obtained without and with intravenous contrast.
CONTRAST:  17mL MULTIHANCE GADOBENATE DIMEGLUMINE 529 MG/ML IV SOLN

[Series 3: T2 · sagittal · 4.0mm · 0.57mm/px · 3 of 16 slices shown (1 of 2)]
[im 1/16]
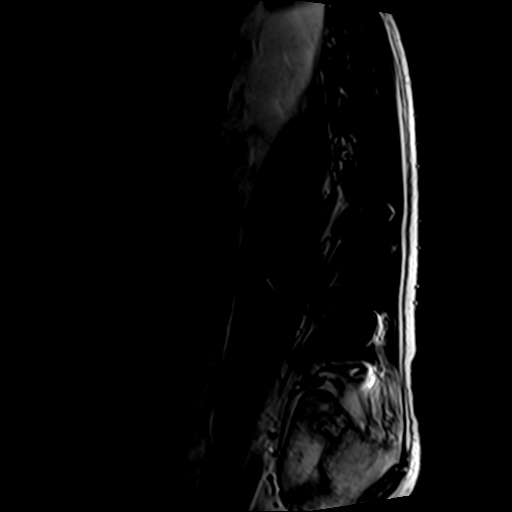
[im 8/16]
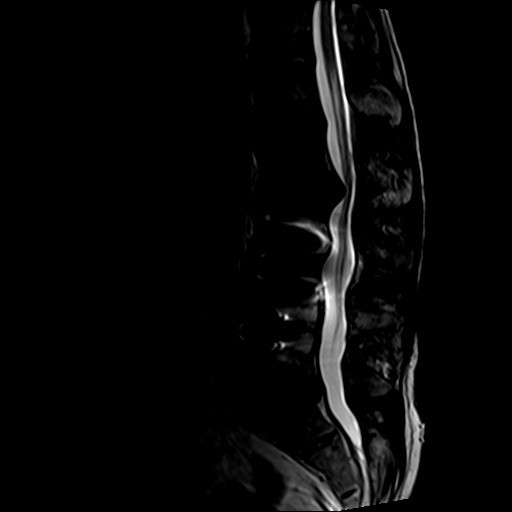
[im 16/16]
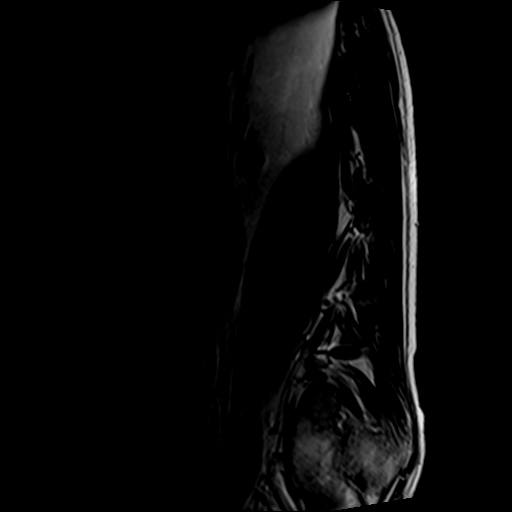

[Series 4: T1 · sagittal · 4.0mm · 0.57mm/px · 4 of 16 slices shown (1 of 3)]
[im 1/16]
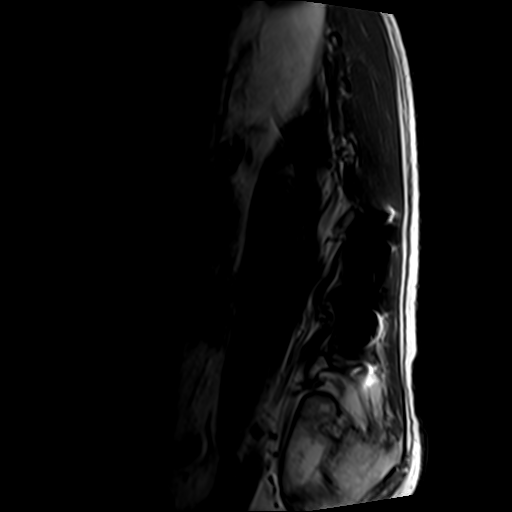
[im 6/16]
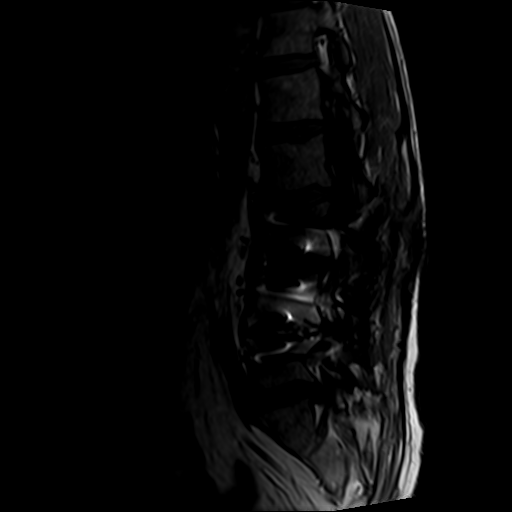
[im 11/16]
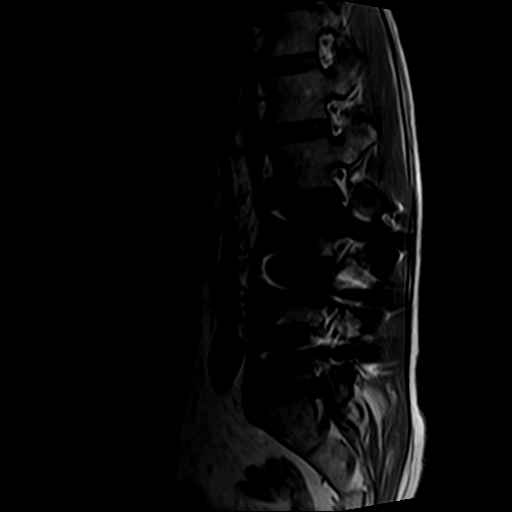
[im 16/16]
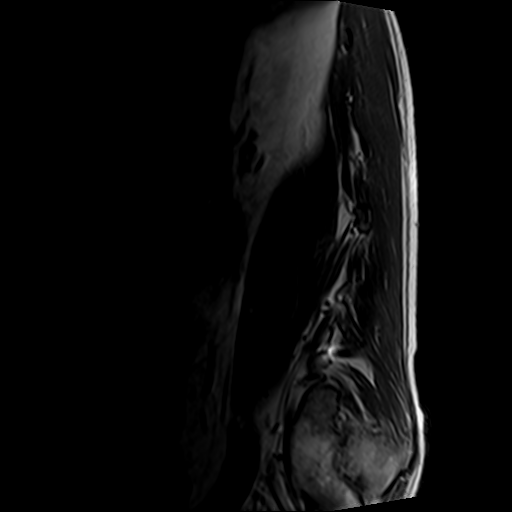

[Series 5: T2 · axial · 4.0mm · 0.70mm/px · z∈[-64,+153]mm · 8 of 41 slices shown (2 of 2)]
[im 1/41]
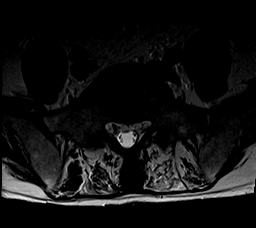
[im 5/41]
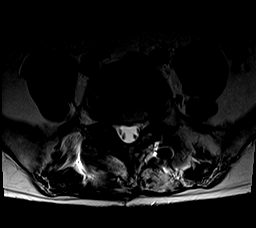
[im 14/41]
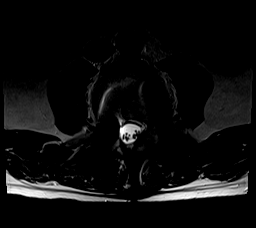
[im 18/41]
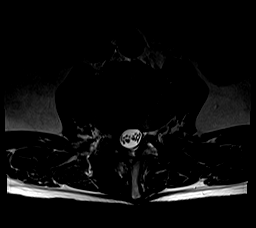
[im 23/41]
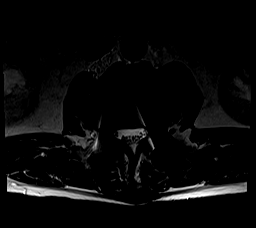
[im 27/41]
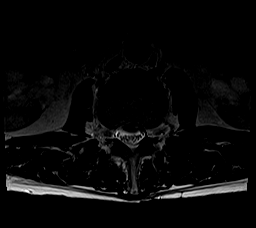
[im 36/41]
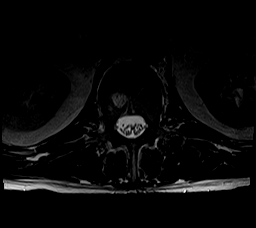
[im 41/41]
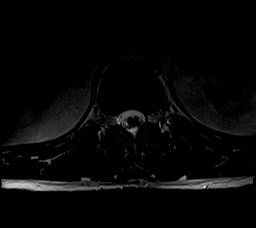

[Series 6: T1 · axial · 4.0mm · 0.35mm/px · z∈[-64,+153]mm · 8 of 41 slices shown (2 of 3)]
[im 1/41]
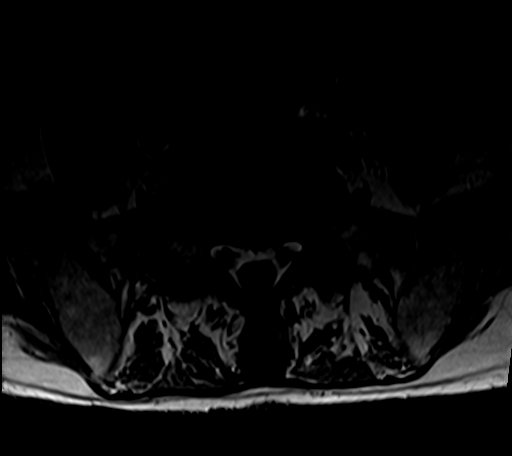
[im 5/41]
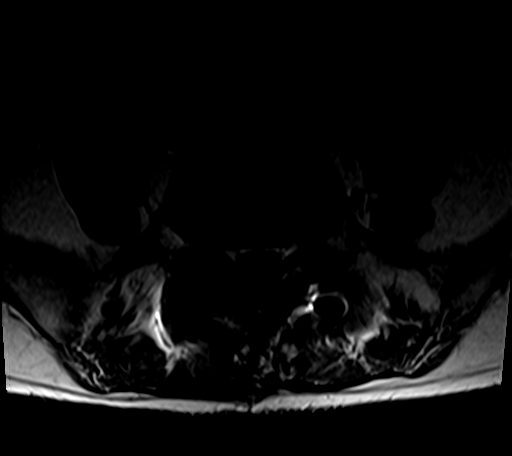
[im 14/41]
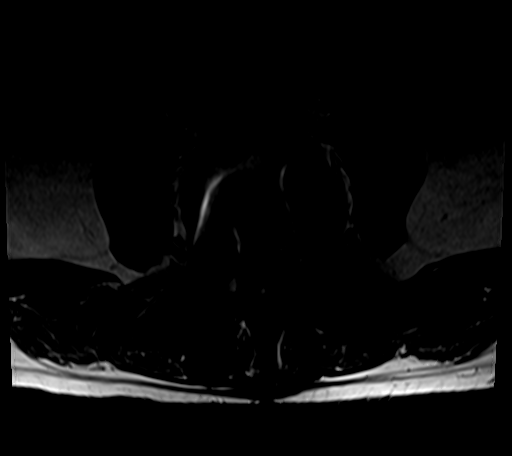
[im 18/41]
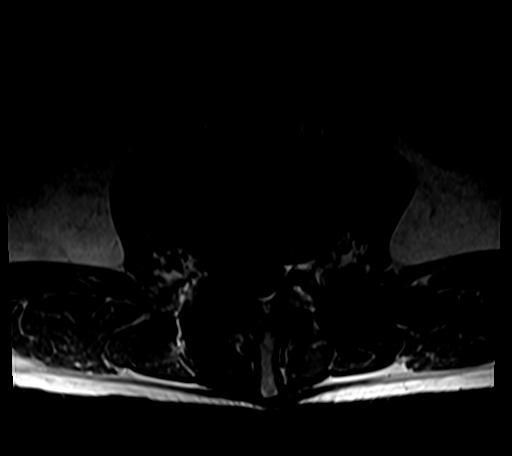
[im 23/41]
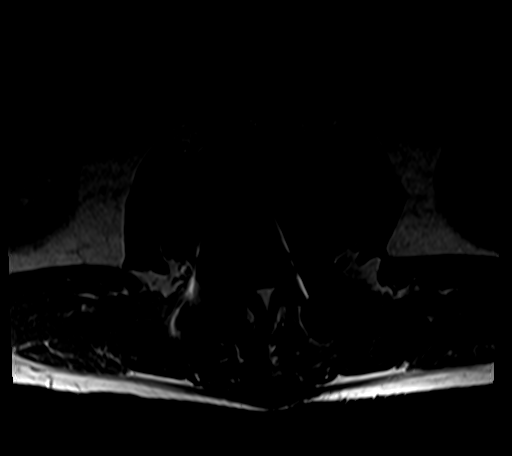
[im 27/41]
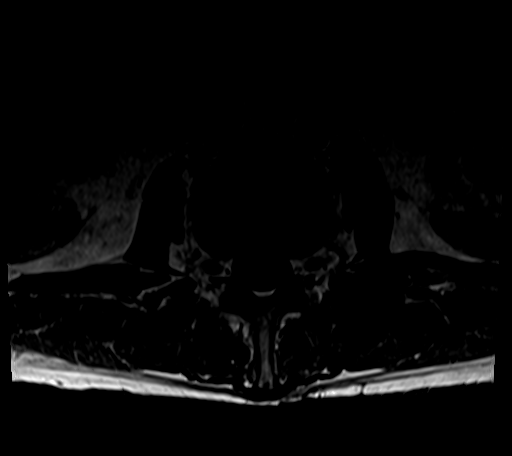
[im 36/41]
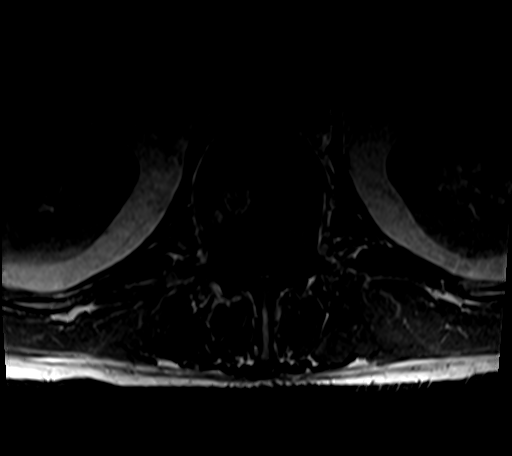
[im 41/41]
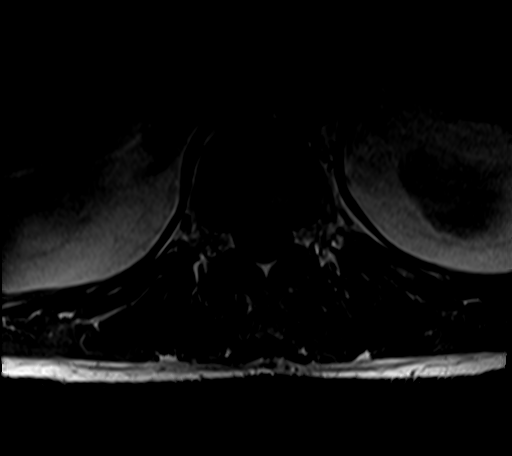

[Series 7: T1 · axial · 4.0mm · 0.35mm/px · 1 of 41 slices shown (3 of 3)]
[im 1/41]
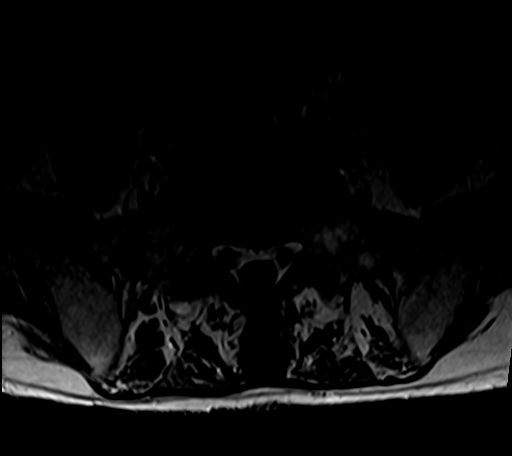

[24 of 48 positions shown; findings below may reference images not displayed]

FINDINGS: Segmentation: Standard. Lowest well-formed disc space labeled the
L5-S1 level.

Alignment: Straightening with reversal of the normal cervical
lordosis, apex at L2-3, stable. No interval listhesis.

Vertebrae: Postoperative changes from prior PLIF at L3-4 and L4-5.
Vertebral body height maintained without acute or chronic fracture.
Bone marrow signal intensity within normal limits. Small benign
hemangioma noted within the L1 vertebral body, stable. No other
discrete or worrisome osseous lesions. No abnormal marrow edema or
enhancement.

Conus medullaris and cauda equina: Conus extends to the L1 level.
Conus medullaris within normal limits. Mild clumping of the nerve
roots of the cauda equina at the level of L4-5, suggesting
arachnoiditis, stable from previous (series 5, image 34). No
abnormal enhancement.

Paraspinal and other soft tissues: Chronic postoperative changes
present within the posterior paraspinous soft tissues. Paraspinous
soft tissues demonstrate no acute finding. Few scattered benign
appearing cyst noted about the kidneys bilaterally. Visualized
visceral structures otherwise unremarkable.

Disc levels:

T12-L1: Normal interspace.  Mild facet hypertrophy.  No stenosis.

L1-2:  Normal interspace.  Mild facet hypertrophy.  No stenosis.

L2-3: Degenerative intervertebral disc space narrowing with diffuse
disc bulge and disc desiccation. Broad-based central disc protrusion
flattens and indents the ventral thecal sac, slightly eccentric to
the right (series 5, image 16). This is increased in size from
previous. Superimposed mild facet hypertrophy. Resultant
mild-to-moderate canal with bilateral subarticular stenosis.
Foramina remain patent.

L3-4: Prior posterior and interbody fusion. No residual spinal
stenosis. Foramina appear grossly patent.

L4-5: Prior posterior and interbody fusion with posterior
decompression. No residual spinal stenosis. Foramina appear grossly
patent.

L5-S1: Disc desiccation with mild disc bulge. Superimposed left
subarticular disc extrusion with inferior migration, contacting and
impinging upon the descending left S1 nerve root (series 5, image
40). This is new and/or increased from previous. There is an
additional smaller right subarticular disc protrusion with annular
fissure (series 5, image 39). This contacts the descending right S1
nerve root as it courses through the right lateral recess. Resultant
moderate bilateral lateral recess stenosis. Central canal remains
patent. Moderate bilateral L5 foraminal stenosis, grossly similar to
previous.
IMPRESSION: 1. New and/or increased left subarticular disc extrusion with
inferior migration at L5-S1, impinging upon the descending left S1
nerve root in the left lateral recess.
2. Additional smaller right subarticular disc protrusion at L5-S1,
contacting the descending right S1 nerve root as it courses through
the right lateral recess.
3. Broad central disc protrusion at L2-3 with resultant
mild-to-moderate canal and bilateral subarticular stenosis. This is
increased in size from previous.
4. Postoperative changes from prior PLIF at L3-4 and L4-5 without
residual or recurrent stenosis.

## 2023-03-25 ENCOUNTER — Ambulatory Visit
Admission: RE | Admit: 2023-03-25 | Discharge: 2023-03-25 | Disposition: A | Discharge status: Home / Self Care | Source: Ambulatory Visit | Attending: Neurosurgery | Admitting: Neurosurgery

## 2023-03-25 DIAGNOSIS — G8929 Other chronic pain: Secondary | ICD-10-CM

## 2023-12-30 ENCOUNTER — Emergency Department (HOSPITAL_COMMUNITY)
Admission: EM | Admit: 2023-12-30 | Discharge: 2023-12-30 | Disposition: A | Discharge status: Home / Self Care | Attending: Emergency Medicine | Admitting: Emergency Medicine

## 2023-12-30 ENCOUNTER — Emergency Department (HOSPITAL_COMMUNITY)

## 2023-12-30 ENCOUNTER — Encounter (HOSPITAL_COMMUNITY): Payer: Self-pay | Admitting: *Deleted

## 2023-12-30 ENCOUNTER — Other Ambulatory Visit: Payer: Self-pay

## 2023-12-30 DIAGNOSIS — M5441 Lumbago with sciatica, right side: Secondary | ICD-10-CM | POA: Insufficient documentation

## 2023-12-30 DIAGNOSIS — Y92410 Unspecified street and highway as the place of occurrence of the external cause: Secondary | ICD-10-CM | POA: Insufficient documentation

## 2023-12-30 DIAGNOSIS — M5442 Lumbago with sciatica, left side: Secondary | ICD-10-CM | POA: Insufficient documentation

## 2023-12-30 HISTORY — DX: Dorsalgia, unspecified: M54.9

## 2023-12-30 MED ORDER — PREDNISONE 20 MG PO TABS: 40.0000 mg | ORAL_TABLET | Freq: Every day | ORAL | 0 refills | Status: AC

## 2023-12-30 MED ORDER — TRAMADOL HCL 50 MG PO TABS
50.0000 mg | ORAL_TABLET | Freq: Once | ORAL | Status: AC
  Administered 2023-12-30: 50 mg via ORAL
  Filled 2023-12-30: qty 1

## 2023-12-30 MED ORDER — OXYCODONE-ACETAMINOPHEN 5-325 MG PO TABS: 1.0000 | ORAL_TABLET | Freq: Four times a day (QID) | ORAL | 0 refills | Status: DC | PRN

## 2023-12-30 MED ORDER — OXYCODONE-ACETAMINOPHEN 5-325 MG PO TABS
1.0000 | ORAL_TABLET | Freq: Once | ORAL | Status: DC
  Filled 2023-12-30: qty 1

## 2023-12-30 MED ORDER — TRAMADOL HCL 50 MG PO TABS: 50.0000 mg | ORAL_TABLET | Freq: Four times a day (QID) | ORAL | 0 refills | Status: AC | PRN
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
## 2023-12-30 NOTE — Discharge Instructions (Signed)
 May take Percocet every 6 hours as needed for severe pain.  Be careful taking Flexeril  at the same time because both can make you drowsy.  Begin taking prednisone  as prescribed.  Continue gabapentin as previously prescribed.  May apply heat pads to the back as well to help with pain.  Continue to move as normal as tolerated to avoid getting stiff.    Follow-up for reevaluation if you can in 1 to 2 weeks for any continued symptoms.  Return to ED if any symptoms worsen including inability to walk, incontinence, numbness/tingling in the groin.
